# Patient Record
Sex: Female | Born: 1995
Health system: Southern US, Community
[De-identification: ages and names within clinical notes are randomized; demographics above are authoritative.]

## PROBLEM LIST (undated history)

## (undated) DIAGNOSIS — J45909 Unspecified asthma, uncomplicated: Secondary | ICD-10-CM

## (undated) DIAGNOSIS — K5900 Constipation, unspecified: Secondary | ICD-10-CM

## (undated) DIAGNOSIS — F988 Other specified behavioral and emotional disorders with onset usually occurring in childhood and adolescence: Secondary | ICD-10-CM

## (undated) DIAGNOSIS — F419 Anxiety disorder, unspecified: Secondary | ICD-10-CM

## (undated) DIAGNOSIS — K219 Gastro-esophageal reflux disease without esophagitis: Secondary | ICD-10-CM

## (undated) DIAGNOSIS — F32A Depression, unspecified: Secondary | ICD-10-CM

## (undated) DIAGNOSIS — F329 Major depressive disorder, single episode, unspecified: Secondary | ICD-10-CM

## (undated) HISTORY — DX: Anxiety disorder, unspecified: F41.9

## (undated) HISTORY — DX: Unspecified asthma, uncomplicated: J45.909

## (undated) HISTORY — DX: Other specified behavioral and emotional disorders with onset usually occurring in childhood and adolescence: F98.8

## (undated) HISTORY — DX: Gastro-esophageal reflux disease without esophagitis: K21.9

## (undated) HISTORY — DX: Constipation, unspecified: K59.00

---
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
## 2016-01-15 ENCOUNTER — Other Ambulatory Visit: Payer: Self-pay | Admitting: Family Medicine

## 2016-05-21 ENCOUNTER — Encounter: Payer: Self-pay | Admitting: Nurse Practitioner

## 2016-05-21 ENCOUNTER — Ambulatory Visit (INDEPENDENT_AMBULATORY_CARE_PROVIDER_SITE_OTHER): Payer: 59 | Admitting: Nurse Practitioner

## 2016-05-21 VITALS — BP 120/78 | Ht 65.0 in | Wt 152.0 lb

## 2016-05-21 DIAGNOSIS — N939 Abnormal uterine and vaginal bleeding, unspecified: Secondary | ICD-10-CM | POA: Diagnosis not present

## 2016-05-21 DIAGNOSIS — K580 Irritable bowel syndrome with diarrhea: Secondary | ICD-10-CM

## 2016-05-21 MED ORDER — HYOSCYAMINE SULFATE 0.125 MG SL SUBL: 0.1250 mg | SUBLINGUAL_TABLET | SUBLINGUAL | Status: DC | PRN

## 2016-05-21 MED ORDER — NORETHINDRONE ACET-ETHINYL EST 1-20 MG-MCG PO TABS: 1.0000 | ORAL_TABLET | Freq: Every day | ORAL | Status: DC

## 2016-05-21 MED FILL — LARIN 21 1-20 TABLET: 1-20 | 84 days supply | Qty: 63 | Fill #0

## 2016-05-21 NOTE — Progress Notes (Signed)
Subjective:  Presents for complaints of vaginal bleeding for the past 3 weeks. Usually has a cycle once a month lasting 5-6 days. Stopped her birth control pills about a month ago. Last intercourse was about 2 months ago with her fianc which was a long-term partner. No new sexual partners since that time. Has been with that sexual partner long-term. Also around the same time began having frequent small diarrhea bowel movements with occasional urgency and no results. No blood in her stool. No nausea vomiting. Intense lower abdominal cramps at times. Has had similar problems in the past with both her cycle in her bowels when she was under stress.  Working and finishing her GED. States she's been under more stress lately. Some difficulty getting a clear cohesive history from patient.  Objective:   BP 120/78 mmHg  Ht 5\' 5"  (1.651 m)  Wt 152 lb (68.947 kg)  BMI 25.29 kg/m2 NAD. Alert, oriented. Lungs clear. Heart regular rate rhythm. Abdomen soft nondistended with active bowel sounds 4; minimal lower abdominal tenderness more towards the left side, no rebound guarding. No specific pelvic pain noted. No obvious masses. Urine hCG negative.    Assessment:  Problem List Items Addressed This Visit      Digestive   Irritable bowel syndrome with diarrhea   Relevant Medications   hyoscyamine (LEVSIN/SL) 0.125 MG SL tablet    Other Visit Diagnoses    Abnormal uterine bleeding    -  Primary       Plan:Defers STD testing. Given written and verbal information on IBS with dietary measures. Warning signs reviewed. Restart birth control pills, take 2 today then 1 each day starting tomorrow. Call back by the end of the week if no improvement in her bowel symptoms or if bleeding persists. Discussed safe sex issues. Use backup method first pack of birth control pills. Meds ordered this encounter  Medications  . hyoscyamine (LEVSIN/SL) 0.125 MG SL tablet    Sig: Place 1 tablet (0.125 mg total) under the tongue  every 4 (four) hours as needed.    Dispense:  30 tablet    Refill:  0    Order Specific Question:  Supervising Provider    Answer:  Merlyn AlbertLUKING, WILLIAM S [[id number]]  . DISCONTD: norethindrone-ethinyl estradiol (MICROGESTIN) 1-20 MG-MCG tablet    Sig: Take 1 tablet by mouth daily.    Dispense:  21 tablet    Refill:  11    Order Specific Question:  Supervising Provider    Answer:  Merlyn AlbertLUKING, WILLIAM S [[id number]]  . norethindrone-ethinyl estradiol (MICROGESTIN) 1-20 MG-MCG tablet    Sig: Take 1 tablet by mouth daily.    Dispense:  3 Package    Refill:  3    Order Specific Question:  Supervising Provider    Answer:  Riccardo DubinLUKING, WILLIAM S [[id number]]

## 2016-05-21 NOTE — Addendum Note (Signed)
Addended by: Margaretha SheffieldBROWN, Onell Mcmath S on: 05/21/2016 03:32 PM   Modules accepted: Orders

## 2016-05-21 NOTE — Patient Instructions (Addendum)
Take 2 birth control pills today then starting tomorrow take one each day.   Irritable Bowel Syndrome, Adult Irritable bowel syndrome (IBS) is not one specific disease. It is a group of symptoms that affects the organs responsible for digestion (gastrointestinal or GI tract).  To regulate how your GI tract works, your body sends signals back and forth between your intestines and your brain. If you have IBS, there may be a problem with these signals. As a result, your GI tract does not function normally. Your intestines may become more sensitive and overreact to certain things. This is especially true when you eat certain foods or when you are under stress.  There are four types of IBS. These may be determined based on the consistency of your stool:   IBS with diarrhea.   IBS with constipation.   Mixed IBS.   Unsubtyped IBS.  It is important to know which type of IBS you have. Some treatments are more likely to be helpful for certain types of IBS.  CAUSES  The exact cause of IBS is not known. RISK FACTORS You may have a higher risk of IBS if:  You are a woman.  You are younger than 19 years old.  You have a family history of IBS.  You have mental health problems.  You have had bacterial infection of your GI tract. SIGNS AND SYMPTOMS  Symptoms of IBS vary from person to person. The main symptom is abdominal pain or discomfort. Additional symptoms usually include one or more of the following:   Diarrhea, constipation, or both.   Abdominal swelling or bloating.   Feeling full or sick after eating a small or regular-size meal.   Frequent gas.   Mucus in the stool.   A feeling of having more stool left after a bowel movement.  Symptoms tend to come and go. They may be associated with stress, psychiatric conditions, or nothing at all.  DIAGNOSIS  There is no specific test to diagnose IBS. Your health care provider will make a diagnosis based on a physical exam,  medical history, and your symptoms. You may have other tests to rule out other conditions that may be causing your symptoms. These may include:   Blood tests.   X-rays.   CT scan.  Endoscopy and colonoscopy. This is a test in which your GI tract is viewed with a long, thin, flexible tube. TREATMENT There is no cure for IBS, but treatment can help relieve symptoms. IBS treatment often includes:   Changes to your diet, such as:  Eating more fiber.  Avoiding foods that cause symptoms.  Drinking more water.  Eating regular, medium-sized portioned meals.  Medicines. These may include:  Fiber supplements if you have constipation.  Medicine to control diarrhea (antidiarrheal medicines).  Medicine to help control muscle spasms in your GI tract (antispasmodic medicines).  Medicines to help with any mental health issues, such as antidepressants or tranquilizers.  Therapy.  Talk therapy may help with anxiety, depression, or other mental health issues that can make IBS symptoms worse.  Stress reduction.  Managing your stress can help keep symptoms under control. HOME CARE INSTRUCTIONS   Take medicines only as directed by your health care provider.  Eat a healthy diet.  Avoid foods and drinks with added sugar.  Include more whole grains, fruits, and vegetables gradually into your diet. This may be especially helpful if you have IBS with constipation.  Avoid any foods and drinks that make your symptoms worse. These  may include dairy products and caffeinated or carbonated drinks.  Do not eat large meals.  Drink enough fluid to keep your urine clear or pale yellow.  Exercise regularly. Ask your health care provider for recommendations of good activities for you.  Keep all follow-up visits as directed by your health care provider. This is important. SEEK MEDICAL CARE IF:   You have constant pain.  You have trouble or pain with swallowing.  You have worsening  diarrhea. SEEK IMMEDIATE MEDICAL CARE IF:   You have severe and worsening abdominal pain.   You have diarrhea and:   You have a rash, stiff neck, or severe headache.   You are irritable, sleepy, or difficult to awaken.   You are weak, dizzy, or extremely thirsty.   You have bright red blood in your stool or you have black tarry stools.   You have unusual abdominal swelling that is painful.   You vomit continuously.   You vomit blood (hematemesis).   You have both abdominal pain and a fever.    This information is not intended to replace advice given to you by your health care provider. Make sure you discuss any questions you have with your health care provider.   Document Released: 12/17/2005 Document Revised: 01/07/2015 Document Reviewed: 09/03/2014 Elsevier Interactive Patient Education 2016 Elsevier Inc.   Diet for Irritable Bowel Syndrome When you have irritable bowel syndrome (IBS), the foods you eat and your eating habits are very important. IBS may cause various symptoms, such as abdominal pain, constipation, or diarrhea. Choosing the right foods can help ease discomfort caused by these symptoms. Work with your health care provider and dietitian to find the best eating plan to help control your symptoms. WHAT GENERAL GUIDELINES DO I NEED TO FOLLOW?  Keep a food diary. This will help you identify foods that cause symptoms. Write down:  What you eat and when.  What symptoms you have.  When symptoms occur in relation to your meals.  Avoid foods that cause symptoms. Talk with your dietitian about other ways to get the same nutrients that are in these foods.  Eat more foods that contain fiber. Take a fiber supplement if directed by your dietitian.  Eat your meals slowly, in a relaxed setting.  Aim to eat 5-6 small meals per day. Do not skip meals.  Drink enough fluids to keep your urine clear or pale yellow.  Ask your health care provider if you  should take an over-the-counter probiotic during flare-ups to help restore healthy gut bacteria.  If you have cramping or diarrhea, try making your meals low in fat and high in carbohydrates. Examples of carbohydrates are pasta, rice, whole grain breads and cereals, fruits, and vegetables.  If dairy products cause your symptoms to flare up, try eating less of them. You might be able to handle yogurt better than other dairy products because it contains bacteria that help with digestion. WHAT FOODS ARE NOT RECOMMENDED? The following are some foods and drinks that may worsen your symptoms:  Fatty foods, such as JamaicaFrench fries.  Milk products, such as cheese or ice cream.  Chocolate.  Alcohol.  Products with caffeine, such as coffee.  Carbonated drinks, such as soda. The items listed above may not be a complete list of foods and beverages to avoid. Contact your dietitian for more information. WHAT FOODS ARE GOOD SOURCES OF FIBER? Your health care provider or dietitian may recommend that you eat more foods that contain fiber. Fiber can help  reduce constipation and other IBS symptoms. Add foods with fiber to your diet a little at a time so that your body can get used to them. Too much fiber at once might cause gas and swelling of your abdomen. The following are some foods that are good sources of fiber:  Apples.  Peaches.  Pears.  Berries.  Figs.  Broccoli (raw).  Cabbage.  Carrots.  Raw peas.  Kidney beans.  Lima beans.  Whole grain bread.  Whole grain cereal. FOR MORE INFORMATION  International Foundation for Functional Gastrointestinal Disorders: www.iffgd.[employer] of Diabetes and Digestive and Kidney Diseases: http://norris-lawson.com/.aspx   This information is not intended to replace advice given to you by your health care provider. Make sure you discuss any questions you have with your health  care provider.   Document Released: 03/08/2004 Document Revised: 01/07/2015 Document Reviewed: 03/19/2014 Elsevier Interactive Patient Education [employer]

## 2016-05-22 LAB — GC/CHLAMYDIA PROBE AMP
Chlamydia trachomatis, NAA: NEGATIVE
Neisseria gonorrhoeae by PCR: NEGATIVE

## 2016-05-31 ENCOUNTER — Encounter: Payer: Self-pay | Admitting: Nurse Practitioner

## 2016-05-31 ENCOUNTER — Ambulatory Visit (INDEPENDENT_AMBULATORY_CARE_PROVIDER_SITE_OTHER): Payer: 59 | Admitting: Nurse Practitioner

## 2016-05-31 VITALS — BP 118/76 | Temp 98.6°F | Ht 65.0 in | Wt 144.0 lb

## 2016-05-31 DIAGNOSIS — N939 Abnormal uterine and vaginal bleeding, unspecified: Secondary | ICD-10-CM | POA: Diagnosis not present

## 2016-05-31 DIAGNOSIS — J0120 Acute ethmoidal sinusitis, unspecified: Secondary | ICD-10-CM | POA: Diagnosis not present

## 2016-05-31 DIAGNOSIS — J209 Acute bronchitis, unspecified: Secondary | ICD-10-CM | POA: Diagnosis not present

## 2016-05-31 DIAGNOSIS — K219 Gastro-esophageal reflux disease without esophagitis: Secondary | ICD-10-CM

## 2016-05-31 MED ORDER — NORGESTIMATE-ETH ESTRADIOL 0.25-35 MG-MCG PO TABS: 1.0000 | ORAL_TABLET | Freq: Every day | ORAL | Status: DC

## 2016-05-31 MED ORDER — AZITHROMYCIN 250 MG PO TABS: ORAL_TABLET | ORAL | Status: DC

## 2016-05-31 MED ORDER — PANTOPRAZOLE SODIUM 40 MG PO TBEC: 40.0000 mg | DELAYED_RELEASE_TABLET | Freq: Every day | ORAL | Status: DC

## 2016-05-31 MED ORDER — HYDROCODONE-HOMATROPINE 5-1.5 MG/5ML PO SYRP: 5.0000 mL | ORAL_SOLUTION | ORAL | Status: DC | PRN

## 2016-05-31 MED ORDER — ONDANSETRON 8 MG PO TBDP: 8.0000 mg | ORAL_TABLET | Freq: Three times a day (TID) | ORAL | Status: DC | PRN

## 2016-05-31 MED ORDER — ALBUTEROL SULFATE HFA 108 (90 BASE) MCG/ACT IN AERS: 2.0000 | INHALATION_SPRAY | RESPIRATORY_TRACT | Status: DC | PRN

## 2016-05-31 MED ORDER — SUCRALFATE 1 GM/10ML PO SUSP: 1.0000 g | Freq: Three times a day (TID) | ORAL | Status: DC

## 2016-05-31 MED FILL — PANTOPRAZOLE SOD DR 40 MG T: 40 | 90 days supply | Qty: 90 | Fill #0

## 2016-05-31 MED FILL — MONO-LINYAH 28 TABLET: 0.25-35 | 84 days supply | Qty: 84 | Fill #0

## 2016-05-31 NOTE — Patient Instructions (Signed)
Food Choices for Gastroesophageal Reflux Disease, Adult When you have gastroesophageal reflux disease (GERD), the foods you eat and your eating habits are very important. Choosing the right foods can help ease the discomfort of GERD. WHAT GENERAL GUIDELINES DO I NEED TO FOLLOW?  Choose fruits, vegetables, whole grains, low-fat dairy products, and low-fat meat, fish, and poultry.  Limit fats such as oils, salad dressings, butter, nuts, and avocado.  Keep a food diary to identify foods that cause symptoms.  Avoid foods that cause reflux. These may be different for different people.  Eat frequent small meals instead of three large meals each day.  Eat your meals slowly, in a relaxed setting.  Limit fried foods.  Cook foods using methods other than frying.  Avoid drinking alcohol.  Avoid drinking large amounts of liquids with your meals.  Avoid bending over or lying down until 2-3 hours after eating. WHAT FOODS ARE NOT RECOMMENDED? The following are some foods and drinks that may worsen your symptoms: Vegetables Tomatoes. Tomato juice. Tomato and spaghetti sauce. Chili peppers. Onion and garlic. Horseradish. Fruits Oranges, grapefruit, and lemon (fruit and juice). Meats High-fat meats, fish, and poultry. This includes hot dogs, ribs, ham, sausage, salami, and bacon. Dairy Whole milk and chocolate milk. Sour cream. Cream. Butter. Ice cream. Cream cheese.  Beverages Coffee and tea, with or without caffeine. Carbonated beverages or energy drinks. Condiments Hot sauce. Barbecue sauce.  Sweets/Desserts Chocolate and cocoa. Donuts. Peppermint and spearmint. Fats and Oils High-fat foods, including French fries and potato chips. Other Vinegar. Strong spices, such as black pepper, white pepper, red pepper, cayenne, curry powder, cloves, ginger, and chili powder. The items listed above may not be a complete list of foods and beverages to avoid. Contact your dietitian for more  information.   This information is not intended to replace advice given to you by your health care provider. Make sure you discuss any questions you have with your health care provider.   Document Released: 12/17/2005 Document Revised: 01/07/2015 Document Reviewed: 10/21/2013 Elsevier Interactive Patient Education 2016 Elsevier Inc.  

## 2016-06-01 ENCOUNTER — Encounter: Payer: Self-pay | Admitting: Nurse Practitioner

## 2016-06-01 DIAGNOSIS — N939 Abnormal uterine and vaginal bleeding, unspecified: Secondary | ICD-10-CM | POA: Insufficient documentation

## 2016-06-01 HISTORY — DX: Abnormal uterine and vaginal bleeding, unspecified: N93.9

## 2016-06-01 NOTE — Progress Notes (Signed)
Subjective:  Presents with her mother for c/o cough and congestion x 7-8 days. Has felt hot at times. Sore throat. Ethmoid area headache and pressure. Head congestion. Producing yellow mucus with streaks of blood at times. Frequent cough. Mild wheezing at night. No longer has an albuterol inhaler. Bilateral ear pain. Has also been having a flareup of her reflux, uncontrolled on omeprazole 40 mg daily. Having nausea and vomiting after eating and drinking, unassociated with any particular foods. Worse at nighttime. Slight diarrhea. Has a history of clearing her throat which is much worse between drainage and reflux. No difficulty swallowing. Minimal caffeine intake. Nonsmoker. Also patient has restarted her Microgestin, bleeding stopped for about 2 days but has persisted. Having severe cramping at times. Has taken this pill without difficulty in the past.  Objective:   BP 118/76 mmHg  Temp(Src) 98.6 F (37 C) (Oral)  Ht 5\' 5"  (1.651 m)  Wt 144 lb (65.318 kg)  BMI 23.96 kg/m2 NAD. Alert, oriented. TMs clear effusion, no erythema. Pharynx mild erythema with green PND noted. Neck supple with mild soft slightly tender anterior adenopathy. Lungs scattered coarse expiratory crackles, no wheezing or tachypnea. Heart regular rate rhythm. Abdomen soft nondistended with distinct epigastric area tenderness, otherwise benign. No obvious masses. Patient frequently clearing her throat.  Assessment:  Problem List Items Addressed This Visit      Digestive   Gastroesophageal reflux   Relevant Medications   ondansetron (ZOFRAN-ODT) 8 MG disintegrating tablet   sucralfate (CARAFATE) 1 GM/10ML suspension   pantoprazole (PROTONIX) 40 MG tablet     Genitourinary   Abnormal uterine bleeding    Other Visit Diagnoses    Acute ethmoidal sinusitis, recurrence not specified    -  Primary    Relevant Medications    azithromycin (ZITHROMAX Z-PAK) 250 MG tablet    HYDROcodone-homatropine (HYCODAN) 5-1.5 MG/5ML syrup    Acute bronchitis, unspecified organism          Plan:  Meds ordered this encounter  Medications  . azithromycin (ZITHROMAX Z-PAK) 250 MG tablet    Sig: Take 2 tablets (500 mg) on  Day 1,  followed by 1 tablet (250 mg) once daily on Days 2 through 5.    Dispense:  6 each    Refill:  0    Order Specific Question:  Supervising Provider    Answer:  Merlyn AlbertLUKING, WILLIAM S [[id number]]  . albuterol (PROVENTIL HFA;VENTOLIN HFA) 108 (90 Base) MCG/ACT inhaler    Sig: Inhale 2 puffs into the lungs every 4 (four) hours as needed.    Dispense:  1 Inhaler    Refill:  0    Order Specific Question:  Supervising Provider    Answer:  Merlyn AlbertLUKING, WILLIAM S [[id number]]  . HYDROcodone-homatropine (HYCODAN) 5-1.5 MG/5ML syrup    Sig: Take 5 mLs by mouth every 4 (four) hours as needed.    Dispense:  90 mL    Refill:  0    Order Specific Question:  Supervising Provider    Answer:  Merlyn AlbertLUKING, WILLIAM S [[id number]]  . ondansetron (ZOFRAN-ODT) 8 MG disintegrating tablet    Sig: Take 1 tablet (8 mg total) by mouth every 8 (eight) hours as needed for nausea or vomiting.    Dispense:  30 tablet    Refill:  0    Order Specific Question:  Supervising Provider    Answer:  Merlyn AlbertLUKING, WILLIAM S [[id number]]  . sucralfate (CARAFATE) 1 GM/10ML suspension    Sig: Take 10 mLs (1 g total) by mouth  4 (four) times daily -  with meals and at bedtime.    Dispense:  420 mL    Refill:  2    Order Specific Question:  Supervising Provider    Answer:  Merlyn Albert [[id number]]  . norgestimate-ethinyl estradiol (ORTHO-CYCLEN,SPRINTEC,PREVIFEM) 0.25-35 MG-MCG tablet    Sig: Take 1 tablet by mouth daily.    Dispense:  3 Package    Refill:  3    Order Specific Question:  Supervising Provider    Answer:  Merlyn Albert [[id number]]  . pantoprazole (PROTONIX) 40 MG tablet    Sig: Take 1 tablet (40 mg total) by mouth daily.    Dispense:  90 tablet    Refill:  1    Order Specific Question:  Supervising Provider    Answer:  Merlyn Albert [[id number]]   Review last  off factors affecting her reflux. Patient or family to call back in 7-10 days if reflux and abdominal pain have not resolved. Consider referral to GI specialist at that time. Warning signs reviewed. Call back in 4-5 days if no improvement in cough congestion, sooner if worse. Also call back in 7 days if bleeding and cramping have not significantly improved. Take current pill through Saturday and start a new pill on Sunday.

## 2017-07-26 ENCOUNTER — Other Ambulatory Visit: Payer: Self-pay | Admitting: Nurse Practitioner

## 2017-08-06 ENCOUNTER — Other Ambulatory Visit: Payer: Self-pay | Admitting: Nurse Practitioner

## 2017-08-06 ENCOUNTER — Other Ambulatory Visit: Payer: Self-pay | Admitting: *Deleted

## 2017-08-06 ENCOUNTER — Telehealth: Payer: Self-pay | Admitting: Family Medicine

## 2017-08-06 MED ORDER — NORGESTIMATE-ETH ESTRADIOL 0.25-35 MG-MCG PO TABS: 1.0000 | ORAL_TABLET | Freq: Every day | ORAL | 0 refills | Status: DC

## 2017-08-06 NOTE — Telephone Encounter (Signed)
Discussed with pt. Pt states she will call back to schedule office visit because she is at work right now and she needs to discuss her schedule with her mother before making appt. Refill sent to pharm.

## 2017-08-06 NOTE — Telephone Encounter (Signed)
One mo only , plz sched either reg o v or preferably  wellness Alison Mcmillan

## 2017-08-06 NOTE — Telephone Encounter (Signed)
Pt is needing refills on norgestimate-ethinyl estradiol (ORTHO-CYCLEN,SPRINTEC,PREVIFEM) 0.25-35 MG-MCG tablet   [facility]

## 2017-08-06 NOTE — Telephone Encounter (Signed)
Last visit 05/31/16. No upcoming appts.

## 2017-09-04 ENCOUNTER — Ambulatory Visit: Payer: 59 | Admitting: Family Medicine

## 2017-09-06 ENCOUNTER — Ambulatory Visit (INDEPENDENT_AMBULATORY_CARE_PROVIDER_SITE_OTHER): Payer: 59 | Admitting: Family Medicine

## 2017-09-06 ENCOUNTER — Encounter: Payer: Self-pay | Admitting: Family Medicine

## 2017-09-06 VITALS — BP 110/70 | Ht 65.0 in | Wt 150.0 lb

## 2017-09-06 DIAGNOSIS — J029 Acute pharyngitis, unspecified: Secondary | ICD-10-CM | POA: Diagnosis not present

## 2017-09-06 DIAGNOSIS — B349 Viral infection, unspecified: Secondary | ICD-10-CM | POA: Diagnosis not present

## 2017-09-06 LAB — POCT RAPID STREP A (OFFICE): Rapid Strep A Screen: NEGATIVE

## 2017-09-06 NOTE — Progress Notes (Signed)
   Subjective:    Patient ID: Alison Mcmillan, female    DOB: 07/29/1996, 21 y.o.   MRN: [id number]  Sore Throat   This is a new problem. Associated symptoms include congestion and coughing. Pertinent negatives include no ear pain or shortness of breath.   Has been a week since symptoms started,but noticed white spots in throat yesterday.Has been using saltwater and cough drops. Significant sore throat some fatigue tiredness symptoms present over the past week worse over the past few days worried about the possibility of strep throat  Review of Systems  Constitutional: Negative for activity change and fever.  HENT: Positive for congestion, rhinorrhea and sore throat. Negative for ear pain.   Eyes: Negative for discharge.  Respiratory: Positive for cough. Negative for shortness of breath and wheezing.   Cardiovascular: Negative for chest pain.       Results for orders placed or performed in visit on 09/06/17  POCT rapid strep A  Result Value Ref Range   Rapid Strep A Screen Negative Negative    Objective:   Physical Exam  Constitutional: She appears well-developed.  HENT:  Head: Normocephalic.  Right Ear: External ear normal.  Left Ear: External ear normal.  Nose: Nose normal.  Mouth/Throat: Oropharyngeal exudate present.  Eyes: Right eye exhibits no discharge. Left eye exhibits no discharge.  Neck: Neck supple. No tracheal deviation present.  Cardiovascular: Normal rate and normal heart sounds.   No murmur heard. Pulmonary/Chest: Effort normal and breath sounds normal. She has no wheezes. She has no rales.  Lymphadenopathy:    She has cervical adenopathy.  Skin: Skin is warm and dry.  Nursing note and vitals reviewed.   Adenopathy is noted. Mainly in the neck, throat erythematous      Assessment & Plan:  Viral syndrome Possible mono Zithromax cover for bacterial component Follow-up if progressive troubles or worse Await lab work

## 2017-09-07 LAB — STREP A DNA PROBE: Strep Gp A Direct, DNA Probe: NEGATIVE

## 2017-09-10 LAB — CBC WITH DIFFERENTIAL/PLATELET
Basophils Absolute: 0 10*3/uL (ref 0.0–0.2)
Basos: 0 %
EOS (ABSOLUTE): 0 10*3/uL (ref 0.0–0.4)
Eos: 1 %
Hematocrit: 38.2 % (ref 34.0–46.6)
Hemoglobin: 12.6 g/dL (ref 11.1–15.9)
Immature Grans (Abs): 0 10*3/uL (ref 0.0–0.1)
Immature Granulocytes: 0 %
Lymphocytes Absolute: 2 10*3/uL (ref 0.7–3.1)
Lymphs: 35 %
MCH: 29.8 pg (ref 26.6–33.0)
MCHC: 33 g/dL (ref 31.5–35.7)
MCV: 90 fL (ref 79–97)
Monocytes Absolute: 0.4 10*3/uL (ref 0.1–0.9)
Monocytes: 7 %
Neutrophils Absolute: 3.2 10*3/uL (ref 1.4–7.0)
Neutrophils: 57 %
Platelets: 293 10*3/uL (ref 150–379)
RBC: 4.23 x10E6/uL (ref 3.77–5.28)
RDW: 12.7 % (ref 12.3–15.4)
WBC: 5.7 10*3/uL (ref 3.4–10.8)

## 2017-09-10 LAB — EPSTEIN-BARR VIRUS VCA ANTIBODY PANEL
EBV Early Antigen Ab, IgG: 39.7 U/mL — ABNORMAL HIGH (ref 0.0–8.9)
EBV NA IgG: 573 U/mL — ABNORMAL HIGH (ref 0.0–17.9)
EBV VCA IgG: >600 U/mL — ABNORMAL HIGH (ref 0.0–17.9)
EBV VCA IgM: <36 U/mL (ref 0.0–35.9)

## 2017-09-11 ENCOUNTER — Encounter: Payer: Self-pay | Admitting: Family Medicine

## 2017-10-14 ENCOUNTER — Encounter: Payer: Self-pay | Admitting: Nurse Practitioner

## 2017-10-14 ENCOUNTER — Ambulatory Visit (INDEPENDENT_AMBULATORY_CARE_PROVIDER_SITE_OTHER): Payer: 59 | Admitting: Nurse Practitioner

## 2017-10-14 VITALS — BP 110/76 | Temp 98.8°F | Ht 65.0 in | Wt 155.0 lb

## 2017-10-14 DIAGNOSIS — R300 Dysuria: Secondary | ICD-10-CM | POA: Diagnosis not present

## 2017-10-14 DIAGNOSIS — Z3041 Encounter for surveillance of contraceptive pills: Secondary | ICD-10-CM

## 2017-10-14 DIAGNOSIS — Z113 Encounter for screening for infections with a predominantly sexual mode of transmission: Secondary | ICD-10-CM

## 2017-10-14 MED ORDER — NITROFURANTOIN MONOHYD MACRO 100 MG PO CAPS: 100.0000 mg | ORAL_CAPSULE | Freq: Two times a day (BID) | ORAL | 0 refills | Status: DC

## 2017-10-14 MED ORDER — NORGESTIMATE-ETH ESTRADIOL 0.25-35 MG-MCG PO TABS: 1.0000 | ORAL_TABLET | Freq: Every day | ORAL | 3 refills | Status: DC

## 2017-10-14 MED FILL — NORG-ETHIN ESTRA 0.25-0.035: 0.25-35 | 84 days supply | Qty: 84 | Fill #0

## 2017-10-14 NOTE — Patient Instructions (Addendum)
May take AZO for 48 hours then stop Birth control sent to [facility] Antibiotics for UTI sent to [facility]

## 2017-10-16 ENCOUNTER — Encounter: Payer: Self-pay | Admitting: Nurse Practitioner

## 2017-10-16 LAB — URINE CULTURE

## 2017-10-16 LAB — CHLAMYDIA/GONOCOCCUS/TRICHOMONAS, NAA
Chlamydia by NAA: NEGATIVE
Gonococcus by NAA: NEGATIVE
Trich vag by NAA: NEGATIVE

## 2017-10-16 NOTE — Progress Notes (Signed)
Subjective:  Presents for recheck on her birth control pills. Regular cycles, normal flow. Rare missed oc. Same sexual partner x 3 years. Acne doing well. Also c/o dysuria off/on x 1 week. No fever. Pain just with urination. Frequency, voiding small amounts at times. Some relief with AZO and cranberry tablets. Has increased her fluid intake. No pelvic pain. No back pain. No vaginal discharge.  Objective:   BP 110/76   Temp 98.8 F (37.1 C) (Oral)   Ht 5\' 5"  (1.651 m)   Wt 155 lb (70.3 kg)   LMP 09/30/2017   BMI 25.79 kg/m  NAD. Alert, oriented. Lungs clear. No CVA tenderness. Heart regular rate rhythm. Abdomen soft nondistended with minimal suprapubic area tenderness. Unable to do UA due to use of AZO. Urine micro occasional epithelial cell, otherwise clear.  Assessment:  Encounter for surveillance of contraceptive pills  Dysuria - Plan: Chlamydia/Gonococcus/Trichomonas, NAA, Urine Culture, CANCELED: POCT urinalysis dipstick  Screen for STD (sexually transmitted disease) - Plan: Chlamydia/Gonococcus/Trichomonas, NAA, Urine Culture    Plan:   Meds ordered this encounter  Medications  . norgestimate-ethinyl estradiol (ORTHO-CYCLEN,SPRINTEC,PREVIFEM) 0.25-35 MG-MCG tablet    Sig: Take 1 tablet by mouth daily.    Dispense:  3 Package    Refill:  3    Order Specific Question:   Supervising Provider    Answer:   Merlyn AlbertLUKING, WILLIAM S [[id number]]  . nitrofurantoin, macrocrystal-monohydrate, (MACROBID) 100 MG capsule    Sig: Take 1 capsule (100 mg total) by mouth 2 (two) times daily.    Dispense:  14 capsule    Refill:  0    Order Specific Question:   Supervising Provider    Answer:   Merlyn AlbertLUKING, WILLIAM S [[id number]]   Urine culture pending. Take AZO for 48 hours then discontinue. Reviewed safe sex issues. STD screening pending. Warning signs reviewed. Further follow-up based on culture results, call back sooner if symptoms worsen. Reminded patient to avoid missing pills. Return if symptoms worsen or  fail to improve.

## 2017-11-27 ENCOUNTER — Encounter: Payer: Self-pay | Admitting: Nurse Practitioner

## 2017-11-27 ENCOUNTER — Ambulatory Visit (INDEPENDENT_AMBULATORY_CARE_PROVIDER_SITE_OTHER): Payer: 59 | Admitting: Nurse Practitioner

## 2017-11-27 VITALS — BP 108/70 | Ht 65.0 in | Wt 165.4 lb

## 2017-11-27 DIAGNOSIS — Z3041 Encounter for surveillance of contraceptive pills: Secondary | ICD-10-CM

## 2017-11-27 MED ORDER — NORETHINDRONE ACET-ETHINYL EST 1-20 MG-MCG PO TABS: 1.0000 | ORAL_TABLET | Freq: Every day | ORAL | 3 refills | Status: DC

## 2017-11-27 MED FILL — LARIN 21 1-20 TABLET: 1-20 | 84 days supply | Qty: 63 | Fill #0

## 2017-11-28 ENCOUNTER — Encounter: Payer: Self-pay | Admitting: Nurse Practitioner

## 2017-11-28 NOTE — Progress Notes (Signed)
Subjective: Presents for recheck on her birth control pills.  Stopped her Ortho-Cyclen after the last pack due to bloating and breakthrough bleeding.  Would like to switch back to Loestrin since she has taken that in the past without difficulty.  Has noticed weight gain over the past 3 months.  Admits to stress eating and increased sugar intake including sodas.  No change in activity.  Has been off her pills for about a week.  Denies any previously missed pills.  Her last menstrual cycle was 1-2 weeks ago.  Same sexual partner for the past 3 years.  Denies fever pelvic pain or discharge.  Objective:   BP 108/70   Ht 5\' 5"  (1.651 m)   Wt 165 lb 6 oz (75 kg)   BMI 27.52 kg/m  NAD.  Alert, oriented.  Lungs clear.  Heart regular rate and rhythm.  Has gained 21 pounds since her visit on 05/31/2016.  Assessment:  Encounter for surveillance of contraceptive pills   Plan:   Meds ordered this encounter  Medications  . norethindrone-ethinyl estradiol (MICROGESTIN,JUNEL,LOESTRIN) 1-20 MG-MCG tablet    Sig: Take 1 tablet by mouth daily.    Dispense:  3 Package    Refill:  3    Order Specific Question:   Supervising Provider    Answer:   Riccardo DubinLUKING, WILLIAM S [[id number]]   Lengthy discussion regarding factors affecting weight gain.  Encouraged regular activity.  Stress reduction.  Also go back to healthy eating habits including diet low in sugar and simple carbs.  Call back if any heavy or prolonged bleeding on her new pill.  Use backup method from now until the first pack is complete. Return in about 1 year (around 11/27/2018).

## 2018-02-12 ENCOUNTER — Encounter: Payer: Self-pay | Admitting: Family Medicine

## 2018-02-12 ENCOUNTER — Ambulatory Visit (INDEPENDENT_AMBULATORY_CARE_PROVIDER_SITE_OTHER): Payer: Self-pay | Admitting: Family Medicine

## 2018-02-12 VITALS — BP 110/68 | Temp 98.2°F | Ht 65.0 in | Wt 164.0 lb

## 2018-02-12 DIAGNOSIS — N3000 Acute cystitis without hematuria: Secondary | ICD-10-CM

## 2018-02-12 DIAGNOSIS — R109 Unspecified abdominal pain: Secondary | ICD-10-CM

## 2018-02-12 LAB — POCT URINALYSIS DIPSTICK
Blood, UA: 50
Spec Grav, UA: >=1.03 — AB (ref 1.010–1.025)
pH, UA: 5 (ref 5.0–8.0)

## 2018-02-12 MED ORDER — SULFAMETHOXAZOLE-TRIMETHOPRIM 800-160 MG PO TABS: ORAL_TABLET | ORAL | 0 refills | Status: DC

## 2018-02-12 MED ORDER — PROMETHAZINE HCL 25 MG PO TABS: 25.0000 mg | ORAL_TABLET | Freq: Three times a day (TID) | ORAL | 0 refills | Status: DC | PRN

## 2018-02-12 NOTE — Progress Notes (Signed)
   Subjective:    Patient ID: Alison SchatzAmelia R Southwood, female    DOB: 11/08/1996, 22 y.o.   MRN: [id number]  Emesis   This is a new problem. Episode onset: one week ago. Associated symptoms include abdominal pain, coughing and diarrhea. Associated symptoms comments: Headache, body aches. Treatments tried: ciprofloxacin, ondansetron.   Went to urgent care last Saturday. Diagnosis with UTI. Taking cipro.   Wake up in the morn feeling a bit nauseated or not, and then vomiting  Took preg test and ngeg, and monthly came along and then neg   Trouble vomiting   Head hurting   Cough off and on the past few days worse with   Results for orders placed or performed in visit on 02/12/18  POCT urinalysis dipstick  Result Value Ref Range   Color, UA     Clarity, UA     Glucose, UA     Bilirubin, UA     Ketones, UA +    Spec Grav, UA >=1.030 (A) 1.010 - 1.025   Blood, UA 50    pH, UA 5.0 5.0 - 8.0   Protein, UA     Urobilinogen, UA  0.2 or 1.0 E.U./dL   Nitrite, UA     Leukocytes, UA  Negative   Appearance     Odor       Review of Systems  Respiratory: Positive for cough.   Gastrointestinal: Positive for abdominal pain, diarrhea and vomiting.       Objective:   Physical Exam   alert active talkative no acute distress.  Lungs clear.  Heart HEENT mild nasal congestion.  Pharynx normal.  The rate and rhythm.  No obvious CVA tenderness positive suprapubic tenderness.  Bladder nontender diffusely right lower quadrant suprapubic and left lower quadrant.  Bowel sounds excellent.    Numerous white blood cells    Assessment & Plan:  Impression partly treated urinary tract with now superimposed respiratory illness.  Plan Bactrim DS twice daily for 10 days.  Warning signs discussed carefully.  If abdominal pain worsens will need for workup discussed.  Phenergan as needed for nausea

## 2018-02-23 ENCOUNTER — Encounter (HOSPITAL_COMMUNITY): Payer: Self-pay | Admitting: Emergency Medicine

## 2018-02-23 ENCOUNTER — Emergency Department (HOSPITAL_COMMUNITY)
Admission: EM | Admit: 2018-02-23 | Discharge: 2018-02-23 | Disposition: A | Discharge status: Home / Self Care | Payer: 59 | Attending: Emergency Medicine | Admitting: Emergency Medicine

## 2018-02-23 DIAGNOSIS — N939 Abnormal uterine and vaginal bleeding, unspecified: Secondary | ICD-10-CM

## 2018-02-23 DIAGNOSIS — S1093XA Contusion of unspecified part of neck, initial encounter: Secondary | ICD-10-CM | POA: Insufficient documentation

## 2018-02-23 DIAGNOSIS — Z79899 Other long term (current) drug therapy: Secondary | ICD-10-CM | POA: Insufficient documentation

## 2018-02-23 DIAGNOSIS — S3023XA Contusion of vagina and vulva, initial encounter: Secondary | ICD-10-CM | POA: Insufficient documentation

## 2018-02-23 DIAGNOSIS — Y998 Other external cause status: Secondary | ICD-10-CM | POA: Insufficient documentation

## 2018-02-23 DIAGNOSIS — Y33XXXA Other specified events, undetermined intent, initial encounter: Secondary | ICD-10-CM | POA: Insufficient documentation

## 2018-02-23 DIAGNOSIS — Y929 Unspecified place or not applicable: Secondary | ICD-10-CM | POA: Insufficient documentation

## 2018-02-23 DIAGNOSIS — J45909 Unspecified asthma, uncomplicated: Secondary | ICD-10-CM | POA: Insufficient documentation

## 2018-02-23 DIAGNOSIS — S3141XA Laceration without foreign body of vagina and vulva, initial encounter: Secondary | ICD-10-CM | POA: Insufficient documentation

## 2018-02-23 DIAGNOSIS — Y9389 Activity, other specified: Secondary | ICD-10-CM | POA: Insufficient documentation
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
## 2018-02-23 NOTE — Discharge Instructions (Signed)
Use ice for comfort.  You can take ibuprofen 600 mg plus Tylenol 650 mg every 6 hours for pain if needed.  [facility] call [facility] to get a follow-up.  Dr. Emelda FearFerguson wants you to avoid sexual activity for at least 2 weeks.  Please call his office if you have any change in condition such as being unable to urinate, worsening pain, heavy bleeding.  You may notice some burning when you urinate due to the abrasions in your groin.  You can rub the painful areas with Vaseline before urinating to see if that helps with the discomfort.

## 2018-02-24 LAB — GC/CHLAMYDIA PROBE AMP (~~LOC~~) NOT AT ARMC
Chlamydia: NEGATIVE
Neisseria Gonorrhea: NEGATIVE

## 2018-04-07 ENCOUNTER — Other Ambulatory Visit: Payer: Self-pay

## 2018-04-07 ENCOUNTER — Telehealth: Payer: Self-pay | Admitting: Family Medicine

## 2018-04-07 MED ORDER — ALBUTEROL SULFATE HFA 108 (90 BASE) MCG/ACT IN AERS: 2.0000 | INHALATION_SPRAY | RESPIRATORY_TRACT | 0 refills | Status: DC | PRN

## 2018-04-07 NOTE — Telephone Encounter (Signed)
Pt is needing a refill on albuterol (PROVENTIL HFA;VENTOLIN HFA) 108 (90 Base) MCG/ACT inhaler   [facility] [place]  

## 2018-04-07 NOTE — Telephone Encounter (Signed)
Please advise 

## 2018-04-07 NOTE — Telephone Encounter (Signed)
Ok plus one ref 

## 2018-06-03 ENCOUNTER — Ambulatory Visit: Payer: Self-pay | Admitting: Family Medicine

## 2018-06-03 ENCOUNTER — Encounter: Payer: Self-pay | Admitting: Family Medicine

## 2018-06-03 VITALS — BP 104/80 | Temp 98.2°F | Ht 65.0 in | Wt 163.0 lb

## 2018-06-03 DIAGNOSIS — J4520 Mild intermittent asthma, uncomplicated: Secondary | ICD-10-CM

## 2018-06-03 DIAGNOSIS — J329 Chronic sinusitis, unspecified: Secondary | ICD-10-CM

## 2018-06-03 DIAGNOSIS — J310 Chronic rhinitis: Secondary | ICD-10-CM

## 2018-06-03 MED ORDER — AMOXICILLIN 500 MG PO CAPS: 500.0000 mg | ORAL_CAPSULE | Freq: Three times a day (TID) | ORAL | 0 refills | Status: DC

## 2018-06-03 NOTE — Progress Notes (Signed)
   Subjective:    Patient ID: Alison Mcmillan, female    DOB: 10/21/1996, 21 y.o.   MRN: [id number]  HPI  Patient is here today due to a cough wheezing and congestion, vomiting, bilateral ear pain but right ear worse,sore throat, headache,sore throat.  Had an inhaler around but has nt picked up yet  Not on any med insurance   Cough feels tight often not an y productoive  yelllow disch arge and cough clear at ties too   Review of Systems No headache, no major weight loss or weight gain, no chest pain no back pain abdominal pain no change in bowel habits complete ROS otherwise negative     Objective:   Physical Exam  Alert, mild malaise. Hydration good Vitals stable. frontal/ maxillary tenderness evident positive nasal congestion. pharynx normal neck supple  lungs clear/no crackles or wheezes. heart regular in rhythm       Assessment & Plan:  Impression rhinosinusitis//bronchitis with element of reactive airways.  Metered-dose inhaler prescribed.  Likely post viral, discussed with patient. plan antibiotics prescribed. Questions answered. Symptomatic care discussed. warning signs discussed. WSL

## 2018-06-13 ENCOUNTER — Other Ambulatory Visit: Payer: Self-pay

## 2018-06-13 ENCOUNTER — Emergency Department (HOSPITAL_COMMUNITY)
Admission: EM | Admit: 2018-06-13 | Discharge: 2018-06-13 | Disposition: A | Discharge status: Home / Self Care | Payer: Self-pay | Attending: Emergency Medicine | Admitting: Emergency Medicine

## 2018-06-13 ENCOUNTER — Encounter (HOSPITAL_COMMUNITY): Payer: Self-pay | Admitting: Emergency Medicine

## 2018-06-13 DIAGNOSIS — F17210 Nicotine dependence, cigarettes, uncomplicated: Secondary | ICD-10-CM | POA: Insufficient documentation

## 2018-06-13 DIAGNOSIS — Z79899 Other long term (current) drug therapy: Secondary | ICD-10-CM | POA: Insufficient documentation

## 2018-06-13 DIAGNOSIS — W57XXXA Bitten or stung by nonvenomous insect and other nonvenomous arthropods, initial encounter: Secondary | ICD-10-CM | POA: Insufficient documentation

## 2018-06-13 DIAGNOSIS — J45909 Unspecified asthma, uncomplicated: Secondary | ICD-10-CM | POA: Insufficient documentation

## 2018-06-13 DIAGNOSIS — Y939 Activity, unspecified: Secondary | ICD-10-CM | POA: Insufficient documentation

## 2018-06-13 DIAGNOSIS — Y929 Unspecified place or not applicable: Secondary | ICD-10-CM | POA: Insufficient documentation

## 2018-06-13 DIAGNOSIS — T63441A Toxic effect of venom of bees, accidental (unintentional), initial encounter: Secondary | ICD-10-CM

## 2018-06-13 DIAGNOSIS — Y999 Unspecified external cause status: Secondary | ICD-10-CM | POA: Insufficient documentation

## 2018-06-13 DIAGNOSIS — S60561A Insect bite (nonvenomous) of right hand, initial encounter: Secondary | ICD-10-CM | POA: Insufficient documentation

## 2018-06-13 MED ORDER — PREDNISONE 10 MG PO TABS: 20.0000 mg | ORAL_TABLET | Freq: Every day | ORAL | 0 refills | Status: DC

## 2018-06-13 MED ORDER — PREDNISONE 20 MG PO TABS
20.0000 mg | ORAL_TABLET | Freq: Once | ORAL | Status: AC
  Administered 2018-06-13: 20 mg via ORAL
  Filled 2018-06-13: qty 1

## 2018-06-13 NOTE — Discharge Instructions (Addendum)
You were seen in the emergency department today following a hornet sting.  We suspect the swelling, itching, pain to be related to this.  Please continue to take Benadryl per over-the-counter dosing instructions as well as apply ice to this area wrapped in a towel ( 20 minutes on 40 minutes off).  We are also sending you home with 2 days worth of steroids.  We gave you a dose in the emergency department.  Please take 20 mg (2 tablets) of steroids tomorrow and the subsequent day.  This should help with the swelling.  Follow-up with your primary care provider within 1 week.  Return to the ER immediately for new or worsening symptoms including but not limited to trouble breathing, facial swelling, feeling like your throat is closing, or any other concerns.

## 2018-06-13 NOTE — ED Triage Notes (Signed)
Patient states she was stung on her right hand by a hornet yesterday. States when she was small she was allergic to bees. Swelling noted to right hand at triage. States she last took benadryl last night.

## 2018-06-13 NOTE — ED Provider Notes (Signed)
[facility] EMERGENCY DEPARTMENT Provider Note   CSN: [id number] Arrival date & time: 06/13/18  1023     History   Chief Complaint Chief Complaint  Patient presents with  . Insect Bite    HPI Alison Mcmillan is a 22 y.o. female with a hx of asthma, depression, IBS, and GERD who presents to the ED with with complaints of right hand swelling itching status post hornet sting last night at 1800. Patient states she was stung on her R 2nd digit- palmar aspect. States that she developed pain and swelling to this area which spread to the hand. She states the area is itchy as well. Woke up this AM and sxs seemed to be a bit worse. She states she took benadryl last night and this AM with some improvement. Also applied ice last night. Rates pain an 8/10 in severity. No specific alleviating/aggravating factors other than what is mentioned above. Denies dyspnea, sensation of throat closing, difficulty swallowing, or stridor at any time since sting. States she was allergic to bees as a child- states would get swelling, never had hx of trouble breathing that she knows of.   HPI  Past Medical History:  Diagnosis Date  . ADD (attention deficit disorder)   . Asthma   . Constipation   . Depression   . Gastroesophageal reflux     Patient Active Problem List   Diagnosis Date Noted  . Abnormal uterine bleeding 06/01/2016  . Irritable bowel syndrome with diarrhea 05/21/2016  . ADHD (attention deficit hyperactivity disorder) 01/20/2014  . ADD (attention deficit disorder) 11/24/2013  . Depression 11/24/2013  . Allergic rhinitis 11/18/2013  . Asthma, chronic 11/04/2013  . Family history of diabetes mellitus 01/20/2013  . Epigastric abdominal pain 01/07/2013  . Gastroesophageal reflux   . Simple constipation     History reviewed. No pertinent surgical history.   OB History   None      Home Medications    Prior to Admission medications   Medication Sig Start Date End Date Taking?  Authorizing Provider  albuterol (PROVENTIL HFA;VENTOLIN HFA) 108 (90 Base) MCG/ACT inhaler Inhale 2 puffs into the lungs every 4 (four) hours as needed. 04/07/18   Babs SciaraLuking, Scott A, MD  amoxicillin (AMOXIL) 500 MG capsule Take 1 capsule (500 mg total) by mouth 3 (three) times daily. 06/03/18   Merlyn AlbertLuking, William S, MD  hyoscyamine (LEVSIN/SL) 0.125 MG SL tablet Place 1 tablet (0.125 mg total) under the tongue every 4 (four) hours as needed. 05/21/16   Campbell RichesHoskins, Carolyn C, NP  norethindrone-ethinyl estradiol (MICROGESTIN,JUNEL,LOESTRIN) 1-20 MG-MCG tablet Take 1 tablet by mouth daily. 11/27/17   Campbell RichesHoskins, Carolyn C, NP  ondansetron (ZOFRAN-ODT) 8 MG disintegrating tablet Take 1 tablet (8 mg total) by mouth every 8 (eight) hours as needed for nausea or vomiting. 05/31/16   Campbell RichesHoskins, Carolyn C, NP  pantoprazole (PROTONIX) 40 MG tablet Take 1 tablet (40 mg total) by mouth daily. 05/31/16   Campbell RichesHoskins, Carolyn C, NP  promethazine (PHENERGAN) 25 MG tablet Take 1 tablet (25 mg total) by mouth every 8 (eight) hours as needed for nausea or vomiting. 02/12/18   Merlyn AlbertLuking, William S, MD  sucralfate (CARAFATE) 1 GM/10ML suspension Take 10 mLs (1 g total) by mouth 4 (four) times daily -  with meals and at bedtime. 05/31/16   Campbell RichesHoskins, Carolyn C, NP  sulfamethoxazole-trimethoprim (BACTRIM DS,SEPTRA DS) 800-160 MG tablet One p o bid for ten d Patient not taking: Reported on 06/03/2018 02/12/18   Merlyn AlbertLuking, William S, MD  Family History Family History  Problem Relation Age of Onset  . Arthritis Sister   . Depression Sister   . ADD / ADHD Brother   . Pancreatitis Mother   . Bipolar disorder Mother   . Drug abuse Mother   . ADD / ADHD Mother   . Pancreatitis Maternal Uncle   . Cholelithiasis Maternal Grandmother   . Bipolar disorder Maternal Grandmother   . Depression Maternal Grandmother   . Pancreatitis Maternal Uncle   . Alcohol abuse Father   . ADD / ADHD Father     Social History Social History   Tobacco Use  . Smoking  status: Current Some Day Smoker  . Smokeless tobacco: Never Used  Substance Use Topics  . Alcohol use: No  . Drug use: No     Allergies   Strattera [atomoxetine hcl]   Review of Systems Review of Systems  Constitutional: Negative for chills and fever.  HENT: Negative for drooling, facial swelling and trouble swallowing.   Respiratory: Negative for choking, shortness of breath, wheezing and stridor.   Musculoskeletal:       Positive for pain, swelling, and itching to the right hand.  Neurological: Negative for weakness and numbness.     Physical Exam Updated Vital Signs BP 128/90 (BP Location: Right Arm)   Pulse 72   Temp 98.8 F (37.1 C) (Oral)   Resp 16   Ht 5\' 5"  (1.651 m)   Wt 73.9 kg (163 lb)   LMP 06/05/2018   SpO2 96%   BMI 27.12 kg/m   Physical Exam  Constitutional: She appears well-developed and well-nourished.  Non-toxic appearance. No distress.  HENT:  Head: Normocephalic and atraumatic.  Right Ear: Tympanic membrane normal.  Left Ear: Tympanic membrane normal.  Nose: Nose normal.  Mouth/Throat: Uvula is midline and oropharynx is clear and moist. No oropharyngeal exudate, posterior oropharyngeal edema or posterior oropharyngeal erythema.  Patient is tolerating her own secretions without difficulty.  Airway is patent.  No trismus.  No drooling.  No hot potato voice.  Eyes: Conjunctivae are normal. Right eye exhibits no discharge. Left eye exhibits no discharge.  Cardiovascular: Normal rate and regular rhythm.  No murmur heard. Pulses:      Radial pulses are 2+ on the right side, and 2+ on the left side.  Pulmonary/Chest: Effort normal and breath sounds normal. No stridor. No respiratory distress. She has no wheezes. She has no rhonchi. She has no rales.  Musculoskeletal:  Upper extremities: Patient has a small puncture area to the skin on the palmar surface of the second right proximal phalanx consistent with sting-no significant surrounding erythema or  discharge from this area.  Patient's right hand is diffusely swollen extending to the distal aspect of the forearm.  She has normal range of motion.  Second digit is tender to palpation in area of bee sting.  Otherwise fairly nontender.  NVI distally.  Neurological: She is alert.  Clear speech.  Sensation grossly intact bilateral upper extremities.  Patient has 5 out of 5 symmetric grip strength.  Psychiatric: She has a normal mood and affect. Her behavior is normal. Thought content normal.  Nursing note and vitals reviewed.        ED Treatments / Results  Labs (all labs ordered are listed, but only abnormal results are displayed) Labs Reviewed - No data to display  EKG None  Radiology No results found.  Procedures Procedures (including critical care time)  Medications Ordered in ED Medications  predniSONE (DELTASONE)  tablet 20 mg (has no administration in time range)    Initial Impression / Assessment and Plan / ED Course  I have reviewed the triage vital signs and the nursing notes.  Pertinent labs & imaging results that were available during my care of the patient were reviewed by me and considered in my medical decision making (see chart for details).   Patient presents status post hornet sting with what appears to be a large localized reaction to R hand/distal forearm.  Patient nontoxic appearing, no apparent distress, vitals WNL.  No respiratory distress, no sensation of throat closing, patient is tolerating her own secretions without difficulty.  No stridor.  No appreciable facial swelling. No indication of anaphylaxis. Recommended continued use of Benadryl and application of ice packs.  Will give very short course of steroids given amount of swelling. I discussed treatment plan, need for PCP follow-up, and strict return precautions with the patient. Provided opportunity for questions, patient confirmed understanding and is in agreement with plan.    Final Clinical  Impressions(s) / ED Diagnoses   Final diagnoses:  Bee sting, accidental or unintentional, initial encounter    ED Discharge Orders        Ordered    predniSONE (DELTASONE) 10 MG tablet  Daily     06/13/18 1303       Cherly Anderson, PA-C 06/13/18 1648    Raeford Razor, MD 06/15/18 [phone]

## 2018-08-13 ENCOUNTER — Encounter: Payer: Self-pay | Admitting: Family Medicine

## 2018-08-13 ENCOUNTER — Ambulatory Visit (INDEPENDENT_AMBULATORY_CARE_PROVIDER_SITE_OTHER): Payer: Self-pay | Admitting: Family Medicine

## 2018-08-13 VITALS — Temp 98.1°F | Ht 65.0 in | Wt 162.4 lb

## 2018-08-13 DIAGNOSIS — R300 Dysuria: Secondary | ICD-10-CM

## 2018-08-13 DIAGNOSIS — N10 Acute tubulo-interstitial nephritis: Secondary | ICD-10-CM

## 2018-08-13 LAB — POCT URINALYSIS DIPSTICK
Spec Grav, UA: 1.02 (ref 1.010–1.025)
pH, UA: 6 (ref 5.0–8.0)

## 2018-08-13 MED ORDER — CIPROFLOXACIN HCL 500 MG PO TABS: 500.0000 mg | ORAL_TABLET | Freq: Two times a day (BID) | ORAL | 0 refills | Status: AC

## 2018-08-13 MED ORDER — ONDANSETRON 4 MG PO TBDP: 4.0000 mg | ORAL_TABLET | Freq: Three times a day (TID) | ORAL | 0 refills | Status: DC | PRN

## 2018-08-13 NOTE — Progress Notes (Signed)
   Subjective:    Patient ID: Alison Mcmillan, female    DOB: 10/15/1996, 22 y.o.   MRN: [id number]  HPI Patient arrives with dysuria and nausea for a week.  One week ago started with dysuria  Then incr freq  Then dim energy   Some nausea  Some vomiting  Headaches  Energy level    Appetite hungry but not as much as susau''   Review of Systems No headache, no major weight loss or weight gain, no chest pain no back pain abdominal pain no change in bowel habits complete ROS otherwise negative     Objective:   Physical Exam  Alert vitals stable, NAD. Blood pressure good on repeat. HEENT normal. Lungs clear. Heart regular rate and rhythm. Mild left CVA tenderness  Urinalysis numerous white blood cells per high-power field  Impression urinary tract infection question element of pyelonephritis discussed with patient Cipro at appropriate dose warning signs discussed Zofran as needed for nausea      Assessment & Plan:

## 2018-08-25 ENCOUNTER — Other Ambulatory Visit: Payer: Self-pay | Admitting: Family Medicine

## 2018-09-05 ENCOUNTER — Telehealth: Payer: Self-pay | Admitting: *Deleted

## 2018-09-05 MED ORDER — NORETHINDRONE ACET-ETHINYL EST 1-20 MG-MCG PO TABS: 1.0000 | ORAL_TABLET | Freq: Every day | ORAL | 1 refills | Status: DC

## 2018-09-05 NOTE — Telephone Encounter (Signed)
Fax from [facility]. Pt requesting a refill on noreth/ethin 1/20 #21 take one daily. Note on request that pt does not like sprintec. Can med be changed back. Last seen for birth control 11/27/17

## 2018-09-05 NOTE — Telephone Encounter (Signed)
Patient would like a refill of noreth/ethin 1/20 (loestrin) #21 take one daily. Patient was on this med before being switched to sprintec and does not like sprintec and wants to switch back

## 2018-09-05 NOTE — Addendum Note (Signed)
Addended by: Margaretha Sheffield on: 09/05/2018 12:55 PM   Modules accepted: Orders

## 2018-09-05 NOTE — Telephone Encounter (Signed)
Please try to help me out- what are they requesting to change back to?

## 2018-09-05 NOTE — Telephone Encounter (Signed)
Prescription sent electronically to pharmacy. 

## 2018-09-05 NOTE — Telephone Encounter (Signed)
It would be fine to give 6 refills of the medicine she is requesting thank you

## 2018-09-08 ENCOUNTER — Telehealth: Payer: Self-pay | Admitting: Family Medicine

## 2018-09-08 MED ORDER — CIPROFLOXACIN HCL 500 MG PO TABS: 500.0000 mg | ORAL_TABLET | Freq: Two times a day (BID) | ORAL | 0 refills | Status: AC

## 2018-09-08 NOTE — Telephone Encounter (Signed)
Patient here on 08/13/18 for UTI and was given Cipro.  Now symptoms are back and she still has some cipro left and wants to know if ok to take?

## 2018-09-08 NOTE — Telephone Encounter (Signed)
Tried calling no vm set up at this time. I have sent in the medication to [facility].

## 2018-09-08 NOTE — Telephone Encounter (Signed)
I would recommend Cipro 500 mg twice daily for 5 days

## 2018-09-08 NOTE — Telephone Encounter (Signed)
Patient seen 08/13/18 with UTI and given Cipro 500 #20 one twice a day for 10 days. Patient did not complete full 10 days of medication and the dysuria hs returned and she wonders if she should finish the Cipro she has left

## 2018-09-12 NOTE — Telephone Encounter (Signed)
I called left a message for the pt to r/c. I called [facility] to see if pt has picked up and per Brett CanalesSteve the medication was still there. He gave a number they had listed of [phone] and no answer at that number.

## 2018-09-18 NOTE — Telephone Encounter (Signed)
Patient notified and stated her symptoms seem to come and go so she is going to fill the prescription and take the full course  to knock it completely out.

## 2018-11-24 ENCOUNTER — Encounter: Payer: Self-pay | Admitting: Family Medicine

## 2018-11-24 ENCOUNTER — Other Ambulatory Visit: Payer: Self-pay | Admitting: Family Medicine

## 2018-11-24 ENCOUNTER — Ambulatory Visit (INDEPENDENT_AMBULATORY_CARE_PROVIDER_SITE_OTHER): Payer: Self-pay | Admitting: Family Medicine

## 2018-11-24 VITALS — BP 122/84 | Temp 98.6°F | Ht 65.0 in | Wt 168.0 lb

## 2018-11-24 DIAGNOSIS — J310 Chronic rhinitis: Secondary | ICD-10-CM

## 2018-11-24 DIAGNOSIS — J329 Chronic sinusitis, unspecified: Secondary | ICD-10-CM

## 2018-11-24 DIAGNOSIS — R300 Dysuria: Secondary | ICD-10-CM

## 2018-11-24 LAB — POCT URINALYSIS DIPSTICK
Spec Grav, UA: >=1.03 — AB (ref 1.010–1.025)
pH, UA: 5 (ref 5.0–8.0)

## 2018-11-24 MED ORDER — BENZONATATE 100 MG PO CAPS: 100.0000 mg | ORAL_CAPSULE | Freq: Three times a day (TID) | ORAL | 0 refills | Status: DC | PRN

## 2018-11-24 MED ORDER — AMOXICILLIN 500 MG PO CAPS: 500.0000 mg | ORAL_CAPSULE | Freq: Three times a day (TID) | ORAL | 0 refills | Status: DC

## 2018-11-24 NOTE — Progress Notes (Signed)
   Subjective:    Patient ID: Alison Mcmillan, female    DOB: 02/03/1996, 22 y.o.   MRN: [id number]  HPI Patient is here today with complaints of a reoccurring uti.She says she has been having some painful urination,sensitive to touch, urine is a dark color she noticed this two days ago. She has not taken any medications for this. She is also having diarrhea and some abd pain.  Last coupe days some burning with urination    Pos diarrhea  pso vomiting , possibly due to cough    Two days ago th  Cough kicked in farly hard,, got the a c turned on which seemed to flar e u things    Some h a some smuscle achres   enetgy level not good   Appetite ok not great     Review of Systems Results for orders placed or performed in visit on 11/24/18  POCT urinalysis dipstick  Result Value Ref Range   Color, UA     Clarity, UA     Glucose, UA     Bilirubin, UA     Ketones, UA     Spec Grav, UA >=1.030 (A) 1.010 - 1.025   Blood, UA 1+    pH, UA 5.0 5.0 - 8.0   Protein, UA     Urobilinogen, UA     Nitrite, UA     Leukocytes, UA Trace (A) Negative   Appearance     Odor         Objective:   Physical Exam  Alert active moderate malaise.  Mild/moderate nasal congestion pharynx normal lungs intermittent bronchial cough heart rate and rhythm  Urinalysis concentrated.  Rare white blood cell      Assessment & Plan:  Impression post viral rhinosinusitis.  May well have had flu.  Doubt UTI.  Plan antibiotics prescribed.  Tessalon prescribed.  Symptom care discussed.

## 2019-01-06 ENCOUNTER — Encounter: Payer: Self-pay | Admitting: Family Medicine

## 2019-01-06 ENCOUNTER — Ambulatory Visit: Payer: Self-pay | Admitting: Family Medicine

## 2019-01-06 ENCOUNTER — Ambulatory Visit (INDEPENDENT_AMBULATORY_CARE_PROVIDER_SITE_OTHER): Payer: Self-pay | Admitting: Family Medicine

## 2019-01-06 VITALS — Temp 98.1°F | Wt 177.2 lb

## 2019-01-06 DIAGNOSIS — J329 Chronic sinusitis, unspecified: Secondary | ICD-10-CM

## 2019-01-06 DIAGNOSIS — J310 Chronic rhinitis: Secondary | ICD-10-CM

## 2019-01-06 MED ORDER — SULFAMETHOXAZOLE-TRIMETHOPRIM 800-160 MG PO TABS: ORAL_TABLET | ORAL | 0 refills | Status: DC

## 2019-01-06 MED ORDER — PREDNISONE 20 MG PO TABS: ORAL_TABLET | ORAL | 0 refills | Status: DC

## 2019-01-06 MED ORDER — BENZONATATE 100 MG PO CAPS: ORAL_CAPSULE | ORAL | 0 refills | Status: DC

## 2019-01-06 NOTE — Progress Notes (Signed)
   Subjective:    Patient ID: Alison Mcmillan, female    DOB: 05-31-1996, 23 y.o.   MRN: [id number]  Cough  This is a recurrent problem. The current episode started more than 1 month ago. The cough is productive of sputum. Associated symptoms include rhinorrhea. Associated symptoms comments: Pt states sometimes she feels as if she is going to pass out from coughing to hard. Treatments tried: Mucinex, Robitussion, Delsym, Sudafed. The treatment provided no relief.   Works at [employer]  No known exposures   All first started a month ago   Had a cold or maybe mild flu  Then developed a cough   Now wheezy at times    Coughing some stuff up, notes also     deep bronchial cough    Using benadryl at night     Review of Systems  HENT: Positive for rhinorrhea.   Respiratory: Positive for cough.        Objective:   Physical Exam Alert active no acute distress though impressive cough.  HEENT mild nasal congestion pharynx normal deep bronchial cough no true wheezes no crackles       Assessment & Plan:  Impression subacute bronchitis plan antibiotics prescribed.  Prednisone taper prescribed.  Strongly encouraged to use albuterol 4 times daily.  Symptom care discussed.  Tessalon Perles.

## 2019-02-19 DIAGNOSIS — Z139 Encounter for screening, unspecified: Secondary | ICD-10-CM

## 2019-02-19 LAB — GLUCOSE, POCT (MANUAL RESULT ENTRY): POC Glucose: 92 mg/dl (ref 70–99)

## 2019-02-19 NOTE — Congregational Nurse Program (Signed)
  Dept: [phone]   Congregational Nurse Program Note  Date of Encounter: 02/19/2019  Past Medical History: Past Medical History:  Diagnosis Date  . ADD (attention deficit disorder)   . Asthma   . Constipation   . Depression   . Gastroesophageal reflux     Encounter Details: CNP Questionnaire - 02/19/19 1003      Questionnaire   Patient Status  Not Applicable    Race  White or Caucasian    Location Patient Served At  [facility]  Not Applicable    Uninsured  Uninsured (NEW 1x/quarter)    Food  Within past 12 months, worried food would run out with no money to buy more;Within past 12 months, food ran out with no money to buy more    Housing/Utilities  Yes, have permanent housing    Transportation  No transportation needs    Interpersonal Safety  Yes, feel physically and emotionally safe where you currently live    Medication  Yes, have medication insecurities    Medical Provider  No    Referrals  Medication Assistance;Primary Care Provider/Clinic;[facility]/Care Connects;Behavioral/Mental Health Provider    ED Visit Averted  Not Applicable    Life-Saving Intervention Made  Not Applicable       Client in today to enroll in Care Connect with C. Broadnax. Client lives with a roommate and is currently working Armed forces operational officer at [employer]. She is uninsured. Her previous medical care has been with Dr. Gerda Diss, but client and father states that with no insurance it is becoming difficult to pay out of pocket. Client last seen by Dr. Gerda Diss on 01/06/19 .   Past Medical History: Asthma GERD ADD/ADHD Depression Irritable bowel syndrome Allergic Rhinitis Constipation  Allergies Strattera unkown.Marland KitchenMarland KitchenMood change   Medications: Albuterol inhaler  Client into [facility] today to enroll in [facility] Connect with Ross Marcus.   Client in with her father present. Alert and oriented to person, place and time. Answers appropriately. Appears nervous. Denies  chest pains or shortness of breath. Does report long standing non productive nagging cough. Client clears throat often. Not currently on medications for GERD. Client also reports recent dental pain upper left molar. Some bleeding at night. Client has been using some gargles. RN encouraged client to use warm salt water gargle and spit out and discussed good oral hygiene. No swelling of the face noted today. Client does report difficulty eating. Client denies thoughts of suicide today and no past attempts per client. Client does report increased anxiety attacks, increased stress and grief over recent loss of her mother. Client reports she has not seen a counselor or mental health provider in years. Client's father reports she didn't take her medicines, but has not seen anyone in  A "very long' time. Discussed beginning with our MSW intern and from there discussing further steps. Client agreeable. Possible appointment pending weather today after 3pm. Client agreeable.   Client referral made [facility] with [facility]. Appointment secured for 02/25/19 at 0815 am. Plan to follow up with client after her first appointment. Discussed dental referral with client and father and how that is done after establishing care with primary care provider, the client would be placed on a dental referral wait list. Client and father state understanding. Discussed if client has increased pain, drainage from dental area, swelling and signs of infection to seek medical care.   Francee Nodal RN

## 2019-02-25 ENCOUNTER — Other Ambulatory Visit (HOSPITAL_COMMUNITY)
Admission: RE | Admit: 2019-02-25 | Discharge: 2019-02-25 | Disposition: A | Discharge status: Home / Self Care | Payer: Self-pay | Source: Ambulatory Visit | Attending: Physician Assistant | Admitting: Physician Assistant

## 2019-02-25 ENCOUNTER — Encounter: Payer: Self-pay | Admitting: Physician Assistant

## 2019-02-25 ENCOUNTER — Ambulatory Visit: Payer: Self-pay | Admitting: Physician Assistant

## 2019-02-25 ENCOUNTER — Telehealth: Payer: Self-pay

## 2019-02-25 VITALS — BP 99/65 | HR 58 | Temp 97.7°F | Ht 66.5 in | Wt 176.0 lb

## 2019-02-25 DIAGNOSIS — Z7689 Persons encountering health services in other specified circumstances: Secondary | ICD-10-CM

## 2019-02-25 DIAGNOSIS — K0889 Other specified disorders of teeth and supporting structures: Secondary | ICD-10-CM

## 2019-02-25 DIAGNOSIS — K219 Gastro-esophageal reflux disease without esophagitis: Secondary | ICD-10-CM

## 2019-02-25 DIAGNOSIS — Z13220 Encounter for screening for lipoid disorders: Secondary | ICD-10-CM

## 2019-02-25 DIAGNOSIS — F419 Anxiety disorder, unspecified: Secondary | ICD-10-CM

## 2019-02-25 LAB — COMPREHENSIVE METABOLIC PANEL
ALT: 12 U/L (ref 0–44)
AST: 15 U/L (ref 15–41)
Albumin: 4.2 g/dL (ref 3.5–5.0)
Alkaline Phosphatase: 63 U/L (ref 38–126)
Anion gap: 8 (ref 5–15)
BUN: 16 mg/dL (ref 6–20)
CO2: 23 mmol/L (ref 22–32)
Calcium: 9 mg/dL (ref 8.9–10.3)
Chloride: 108 mmol/L (ref 98–111)
Creatinine, Ser: 0.66 mg/dL (ref 0.44–1.00)
GFR calc Af Amer: >60 mL/min (ref >60)
GFR calc non Af Amer: >60 mL/min (ref >60)
Glucose, Bld: 106 mg/dL — ABNORMAL HIGH (ref 70–99)
Potassium: 4.4 mmol/L (ref 3.5–5.1)
Sodium: 139 mmol/L (ref 135–145)
Total Bilirubin: 0.2 mg/dL — ABNORMAL LOW (ref 0.3–1.2)
Total Protein: 7.4 g/dL (ref 6.5–8.1)

## 2019-02-25 LAB — LIPID PANEL
Cholesterol: 160 mg/dL (ref 0–200)
HDL: 42 mg/dL (ref >40)
LDL Cholesterol: 102 mg/dL — ABNORMAL HIGH (ref 0–99)
Total CHOL/HDL Ratio: 3.8 RATIO
Triglycerides: 80 mg/dL (ref <150)
VLDL: 16 mg/dL (ref 0–40)

## 2019-02-25 MED ORDER — OMEPRAZOLE 40 MG PO CPDR: 40.0000 mg | DELAYED_RELEASE_CAPSULE | Freq: Every day | ORAL | 3 refills | Status: DC

## 2019-02-25 MED ORDER — OMEPRAZOLE 40 MG PO CPDR: 40.0000 mg | DELAYED_RELEASE_CAPSULE | Freq: Every day | ORAL | 1 refills | Status: DC

## 2019-02-25 NOTE — Progress Notes (Signed)
BP 99/65 (BP Location: Right Arm, Patient Position: Sitting, Cuff Size: Normal)   Pulse (!) 58   Temp 97.7 F (36.5 C)   Ht 5' 6.5" (1.689 m)   Wt 176 lb (79.8 kg)   SpO2 99%   BMI 27.98 kg/m    Subjective:    Patient ID: Alison Mcmillan, female    DOB: September 17, 1996, 23 y.o.   MRN: [id number]  HPI: Alison Mcmillan is a 23 y.o. female presenting on 02/25/2019 for New Patient (Initial Visit)   HPI   Pt works at [employer].  Pt was adopted by her grandparents due to her bio mom having drug problems  Pt states having reflux for years and clears her throat chronically.  She was taking medication for it at one time but she didn't like taking the medication for it so she stopped (she says the pills were big).  Epic shows that she saw pediatric GI in 2014-  At that time pt was taking omeprazole and miralax  Pt complains of a tooth problem that has been hurting, bleeding and is sensitive  Pt does have some anxiety but hasn't seen mental health provider yet.    Relevant past medical, surgical, family and social history reviewed and updated as indicated. Interim medical history since our last visit reviewed. Allergies and medications reviewed and updated.   Current Outpatient Medications:  .  albuterol (PROVENTIL HFA;VENTOLIN HFA) 108 (90 Base) MCG/ACT inhaler, Inhale 2 puffs into the lungs every 4 (four) hours as needed., Disp: 1 Inhaler, Rfl: 0 .  norethindrone-ethinyl estradiol (MICROGESTIN,JUNEL,LOESTRIN) 1-20 MG-MCG tablet, Take 1 tablet by mouth daily., Disp: , Rfl:    Review of Systems  Constitutional: Positive for fatigue. Negative for appetite change, chills, diaphoresis, fever and unexpected weight change.  HENT: Positive for dental problem, ear pain and mouth sores. Negative for congestion, drooling, facial swelling, hearing loss, sneezing, sore throat, trouble swallowing and voice change.   Eyes: Negative for pain, discharge, redness, itching and visual  disturbance.  Respiratory: Negative for cough, choking, shortness of breath and wheezing.   Cardiovascular: Negative for chest pain, palpitations and leg swelling.  Gastrointestinal: Negative for abdominal pain, blood in stool, constipation, diarrhea and vomiting.  Endocrine: Negative for cold intolerance, heat intolerance and polydipsia.  Genitourinary: Negative for decreased urine volume, dysuria and hematuria.  Musculoskeletal: Negative for arthralgias, back pain and gait problem.  Skin: Negative for rash.  Allergic/Immunologic: Negative for environmental allergies.  Neurological: Positive for headaches. Negative for seizures, syncope and light-headedness.  Hematological: Negative for adenopathy.  Psychiatric/Behavioral: Positive for dysphoric mood. Negative for agitation and suicidal ideas. The patient is not nervous/anxious.     Per HPI unless specifically indicated above     Objective:    BP 99/65 (BP Location: Right Arm, Patient Position: Sitting, Cuff Size: Normal)   Pulse (!) 58   Temp 97.7 F (36.5 C)   Ht 5' 6.5" (1.689 m)   Wt 176 lb (79.8 kg)   SpO2 99%   BMI 27.98 kg/m   Wt Readings from Last 3 Encounters:  02/25/19 176 lb (79.8 kg)  02/19/19 177 lb (80.3 kg)  01/06/19 177 lb 3.2 oz (80.4 kg)    Physical Exam Vitals signs reviewed.  Constitutional:      Appearance: She is well-developed.     Comments: Pt with frequent throat clearing  HENT:     Head: Normocephalic and atraumatic.     Mouth/Throat:     Dentition: Dental tenderness  present. No dental abscesses.     Pharynx: Oropharynx is clear. Uvula midline. No oropharyngeal exudate.     Comments: Left lower molar with gum growing over half of the tooth.   Eyes:     Conjunctiva/sclera: Conjunctivae normal.     Pupils: Pupils are equal, round, and reactive to light.  Neck:     Musculoskeletal: Neck supple.     Thyroid: No thyromegaly.  Cardiovascular:     Rate and Rhythm: Normal rate and regular rhythm.   Pulmonary:     Effort: Pulmonary effort is normal.     Breath sounds: Normal breath sounds.  Abdominal:     General: Bowel sounds are normal.     Palpations: Abdomen is soft. There is no mass.     Tenderness: There is no abdominal tenderness.  Lymphadenopathy:     Cervical: No cervical adenopathy.  Skin:    General: Skin is warm and dry.  Neurological:     Mental Status: She is alert and oriented to person, place, and time.     Gait: Gait normal.  Psychiatric:        Behavior: Behavior normal.     Results for orders placed or performed in visit on 02/19/19  POCT glucose  Result Value Ref Range   POC Glucose 92 70 - 99 mg/dl      Assessment & Plan:    Encounter Diagnoses  Name Primary?  . Encounter to establish care Yes  . Gastroesophageal reflux disease, esophagitis presence not specified   . Dentalgia   . Anxiety   . Screening cholesterol level     -pt is put on Dental list -pt to restart Omeprazole.  [facility] -will Check lipids -Gave [facility] information to pt so she can get counseling to help with her anxiety -recommended pt apply for family planning medicaid to help with her contraceptive management as this is something that [facility] does not provide -pt to follow up here 1 month to recheck GERD and review labs.   RTO sooner prn

## 2019-02-25 NOTE — Telephone Encounter (Signed)
Called to follow up with client after [facility]. Client states everything went well and she will be starting medications for her acid reflux, omeprazole. She will pick her medicine up from [facility] today. Encouraged client regarding mental health. Discussed again about [facility]. Client was scheduled to come in to see MSW intern last week, but we had to reschedule due to weather. Client still wants to meet with MSW intern first. Client apprehensive about [facility] at this time.  Appointment scheduled for 03/12/19 at 0930 with Reino Bellis MSW [facility].  Client states provider at [facility] will send referral for Dental.  Will continue to follow for Case Management with [facility].   Francee Nodal RN

## 2019-03-04 ENCOUNTER — Encounter: Payer: Self-pay | Admitting: Physician Assistant

## 2019-03-04 ENCOUNTER — Ambulatory Visit: Payer: Self-pay | Admitting: Physician Assistant

## 2019-03-04 VITALS — BP 116/74 | HR 77 | Temp 97.7°F | Ht 66.5 in | Wt 177.5 lb

## 2019-03-04 DIAGNOSIS — R300 Dysuria: Secondary | ICD-10-CM

## 2019-03-04 DIAGNOSIS — N3090 Cystitis, unspecified without hematuria: Secondary | ICD-10-CM

## 2019-03-04 LAB — POCT URINALYSIS DIPSTICK
Bilirubin, UA: NEGATIVE
Glucose, UA: NEGATIVE
Ketones, UA: NEGATIVE
Nitrite, UA: NEGATIVE
Protein, UA: POSITIVE — AB
Spec Grav, UA: 1.025 (ref 1.010–1.025)
Urobilinogen, UA: 0.2 E.U./dL
pH, UA: 6.5 (ref 5.0–8.0)

## 2019-03-04 MED ORDER — SULFAMETHOXAZOLE-TRIMETHOPRIM 800-160 MG PO TABS: 1.0000 | ORAL_TABLET | Freq: Two times a day (BID) | ORAL | 0 refills | Status: DC

## 2019-03-04 NOTE — Patient Instructions (Signed)

## 2019-03-04 NOTE — Progress Notes (Signed)
BP 116/74 (BP Location: Left Arm, Patient Position: Sitting, Cuff Size: Normal)   Pulse 77   Temp 97.7 F (36.5 C) (Other (Comment))   Ht 5' 6.5" (1.689 m)   Wt 177 lb 8 oz (80.5 kg)   SpO2 97%   BMI 28.22 kg/m    Subjective:    Patient ID: Alison Mcmillan, female    DOB: 07/23/1996, 23 y.o.   MRN: [id number]  Alison Mcmillan is a 23 y.o. female presenting on 03/04/2019 for Urinary Tract Infection (c/o urinary frequency, small amounts, burning)   HPI  Symptoms since yesterday.  LMP 2 wk ago.  No fevers  Relevant past medical, surgical, family and social history reviewed and updated as indicated. Interim medical history since our last visit reviewed. Allergies and medications reviewed and updated.  Review of Systems  Constitutional: Positive for fatigue. Negative for appetite change, chills, diaphoresis, fever and unexpected weight change.  HENT: Negative for congestion, dental problem, drooling, ear pain, facial swelling, hearing loss, mouth sores, sneezing, sore throat, trouble swallowing and voice change.   Eyes: Negative for pain, discharge, redness, itching and visual disturbance.  Respiratory: Negative for cough, choking, shortness of breath and wheezing.   Cardiovascular: Negative for chest pain, palpitations and leg swelling.  Gastrointestinal: Positive for abdominal pain. Negative for blood in stool, constipation, diarrhea and vomiting.  Endocrine: Negative for cold intolerance, heat intolerance and polydipsia.  Genitourinary: Positive for decreased urine volume and dysuria. Negative for hematuria.  Musculoskeletal: Negative for arthralgias, back pain and gait problem.  Skin: Negative for rash.  Allergic/Immunologic: Negative for environmental allergies.  Neurological: Negative for seizures, syncope, light-headedness and headaches.  Hematological: Negative for adenopathy.  Psychiatric/Behavioral: Negative for agitation, dysphoric mood and suicidal ideas. The  patient is not nervous/anxious.     Per HPI unless specifically indicated above     Objective:    BP 116/74 (BP Location: Left Arm, Patient Position: Sitting, Cuff Size: Normal)   Pulse 77   Temp 97.7 F (36.5 C) (Other (Comment))   Ht 5' 6.5" (1.689 m)   Wt 177 lb 8 oz (80.5 kg)   SpO2 97%   BMI 28.22 kg/m   Wt Readings from Last 3 Encounters:  03/04/19 177 lb 8 oz (80.5 kg)  02/25/19 176 lb (79.8 kg)  02/19/19 177 lb (80.3 kg)    Physical Exam Vitals signs reviewed.  Constitutional:      Appearance: She is well-developed.  HENT:     Head: Normocephalic and atraumatic.  Neck:     Musculoskeletal: Neck supple.  Cardiovascular:     Rate and Rhythm: Normal rate and regular rhythm.  Pulmonary:     Effort: Pulmonary effort is normal.     Breath sounds: Normal breath sounds.  Abdominal:     General: Bowel sounds are normal.     Palpations: Abdomen is soft. There is no mass.     Tenderness: There is no abdominal tenderness.  Lymphadenopathy:     Cervical: No cervical adenopathy.  Skin:    General: Skin is warm and dry.  Neurological:     Mental Status: She is alert and oriented to person, place, and time.  Psychiatric:        Behavior: Behavior normal.         Assessment & Plan:   Encounter Diagnoses  Name Primary?  . Cystitis Yes  . Dysuria      -rx septra ds -Counseled pt on UTI -pt to follow up  as scheduled.  RTO sooner prn

## 2019-03-26 ENCOUNTER — Ambulatory Visit: Payer: Self-pay | Admitting: Physician Assistant

## 2019-03-26 ENCOUNTER — Encounter: Payer: Self-pay | Admitting: Physician Assistant

## 2019-03-26 DIAGNOSIS — K219 Gastro-esophageal reflux disease without esophagitis: Secondary | ICD-10-CM

## 2019-03-26 DIAGNOSIS — F419 Anxiety disorder, unspecified: Secondary | ICD-10-CM

## 2019-03-26 MED ORDER — ALBUTEROL SULFATE HFA 108 (90 BASE) MCG/ACT IN AERS: 2.0000 | INHALATION_SPRAY | RESPIRATORY_TRACT | 0 refills | Status: DC | PRN

## 2019-03-26 NOTE — Progress Notes (Signed)
   There were no vitals taken for this visit.   Subjective:    Patient ID: Alison Mcmillan, female    DOB: [birth year], 23 y.o.   MRN: [id number]  HPI: Alison Mcmillan is a 23 y.o. female presenting on 03/26/2019 for No chief complaint on file.   HPI  This is a telemedicine visit through [employer] due to the coronavirus pandemic  She is using the omeprazole.   She thinks she has a bit more sputum when she clears her throat than she did when she was in office a month ago.  No SOB. No fever.   She did not contact [facility].  She is still having some anxiety but thinks it is doing better.   Pt says she only uses her inhaler at times, usually when she gets anxiety, but that she needs a refill because hers is out.     Relevant past medical, surgical, family and social history reviewed and updated as indicated. Interim medical history since our last visit reviewed. Allergies and medications reviewed and updated.   Current Outpatient Medications:  .  albuterol (PROVENTIL HFA;VENTOLIN HFA) 108 (90 Base) MCG/ACT inhaler, Inhale 2 puffs into the lungs every 4 (four) hours as needed., Disp: 1 Inhaler, Rfl: 0 .  norethindrone-ethinyl estradiol (MICROGESTIN,JUNEL,LOESTRIN) 1-20 MG-MCG tablet, Take 1 tablet by mouth daily., Disp: , Rfl:  .  omeprazole (PRILOSEC) 40 MG capsule, Take 1 capsule (40 mg total) by mouth daily., Disp: 90 capsule, Rfl: 1   Review of Systems  Per HPI unless specifically indicated above     Objective:    There were no vitals taken for this visit.  Wt Readings from Last 3 Encounters:  03/04/19 177 lb 8 oz (80.5 kg)  02/25/19 176 lb (79.8 kg)  02/19/19 177 lb (80.3 kg)    Physical Exam Constitutional:      Appearance: Normal appearance.  HENT:     Head: Normocephalic and atraumatic.  Pulmonary:     Effort: Pulmonary effort is normal. No respiratory distress.  Neurological:     Mental Status: She is alert and oriented to person, place, and time.  Psychiatric:         Mood and Affect: Mood normal.        Behavior: Behavior normal.     Comments: Pt is talking normally and appropriately.          Assessment & Plan:   Encounter Diagnoses  Name Primary?  . Gastroesophageal reflux disease, esophagitis presence not specified Yes  . Anxiety     -reviewed labs with pt -ordered albuterol MDI from [facility] -encouraged pt to contact [facility] for help with the anxiety -pt to Continue omeprazole -Follow up 57months.  RTO sooner prn

## 2019-05-19 ENCOUNTER — Ambulatory Visit: Payer: Self-pay | Admitting: Physician Assistant

## 2019-05-19 ENCOUNTER — Encounter: Payer: Self-pay | Admitting: Physician Assistant

## 2019-05-19 DIAGNOSIS — K219 Gastro-esophageal reflux disease without esophagitis: Secondary | ICD-10-CM

## 2019-05-19 DIAGNOSIS — F419 Anxiety disorder, unspecified: Secondary | ICD-10-CM

## 2019-05-19 NOTE — Progress Notes (Signed)
There were no vitals taken for this visit.   Subjective:    Patient ID: Alison Mcmillan, female    DOB: Sep 01, 1996, 23 y.o.   MRN: [id number]  HPI: Alison Mcmillan is a 23 y.o. female presenting on 05/19/2019 for No chief complaint on file.   HPI   This is a telemedicine appointment through [employer] due to coronavirus pandemic  I connected with  [name] on 05/19/19 by a video enabled telemedicine application and verified that I am speaking with the correct person using two identifiers.   I discussed the limitations of evaluation and management by telemedicine. The patient expressed understanding and agreed to proceed.  Pt is at work at [employer] and provider is at [employer] is still having anxiety.  She says Her bio mom recently dietd(her grandmother raise/adopted her).  She says she is having a lot more panic attacks lately.  She self treats her panic attacks with her inhaler.  She has still not contacted [facility] as encouraged at several previous office visits.    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  She says she is feeling well and is not having any fevers.  No sob except when has a panic attack.        Relevant past medical, surgical, family and social history reviewed and updated as indicated. Interim medical history since our last visit reviewed. Allergies and medications reviewed and updated.   Current Outpatient Medications:  .  albuterol (PROVENTIL HFA;VENTOLIN HFA) 108 (90 Base) MCG/ACT inhaler, Inhale 2 puffs into the lungs every 4 (four) hours as needed., Disp: 2 Inhaler, Rfl: 0 .  norethindrone-ethinyl estradiol (MICROGESTIN,JUNEL,LOESTRIN) 1-20 MG-MCG tablet, Take 1 tablet by mouth daily., Disp: , Rfl:  .  omeprazole (PRILOSEC) 40 MG capsule, Take 1 capsule (40 mg total) by mouth daily., Disp: 90 capsule, Rfl: 1   Review of Systems   Per HPI unless specifically indicated above     Objective:    There were  no vitals taken for this visit.  Wt Readings from Last 3 Encounters:  03/04/19 177 lb 8 oz (80.5 kg)  02/25/19 176 lb (79.8 kg)  02/19/19 177 lb (80.3 kg)    Physical Exam Constitutional:      General: She is not in acute distress.    Appearance: Normal appearance. She is not ill-appearing.  HENT:     Head: Normocephalic and atraumatic.  Pulmonary:     Effort: Pulmonary effort is normal. No respiratory distress.  Neurological:     Mental Status: She is alert and oriented to person, place, and time.  Psychiatric:        Attention and Perception: Attention normal.        Mood and Affect: Mood normal.        Speech: Speech normal.        Behavior: Behavior is cooperative.           Assessment & Plan:   Encounter Diagnoses  Name Primary?  . Gastroesophageal reflux disease, esophagitis presence not specified Yes  . Anxiety     -encouraged pt to contact [facility] for help with anxiety and panic attacks.  She still has the card that she was given at last OV -pt to continue current medications -pt encouraged to continue to wear mask when she interacts with customers at work and wash her hands frequently -pt to follow up in 3 months.  She is to contact office sooner  prn

## 2019-06-08 ENCOUNTER — Ambulatory Visit: Payer: Self-pay | Admitting: Physician Assistant

## 2019-06-08 ENCOUNTER — Encounter: Payer: Self-pay | Admitting: Physician Assistant

## 2019-06-08 DIAGNOSIS — M7989 Other specified soft tissue disorders: Secondary | ICD-10-CM

## 2019-06-08 NOTE — Progress Notes (Signed)
There were no vitals taken for this visit.   Subjective:    Patient ID: Alison Mcmillan, female    DOB: 03-Nov-1996, 23 y.o.   MRN: [id number]  HPI: Alison Mcmillan is a 23 y.o. female presenting on 06/08/2019 for No chief complaint on file.   HPI   This is a telemedicine visit due to coronavirus pandemic.  It is via Telephone as pt is unanble to get the video on the phone to work today  I connected with  [name] on 06/08/2019 by a video enabled telemedicine application and verified that I am speaking with the correct person using two identifiers.   I discussed the limitations of evaluation and management by telemedicine. The patient expressed understanding and agreed to proceed.   Pt complains of Pain & swelling in toes and some discomfort in the shin area also.  She says this has been Off and on swelling for past several days.  There is no redness or open wound.  The toes are not swelled at the present moment but she says it comes and goes.   Pt at work at [employer] also relates Some nausea. No  Fevers.  LMP 3 wk ago.  She is sexually active and is on OCP.  She has missed a dose or two but then doubled up the next day.    No new shoes for work lately.  She wears Vans.  She wears them to work and when not at work.  She exercise for about 15 minutes once or twice week.      Relevant past medical, surgical, family and social history reviewed and updated as indicated. Interim medical history since our last visit reviewed. Allergies and medications reviewed and updated.   Current Outpatient Medications:  .  albuterol (PROVENTIL HFA;VENTOLIN HFA) 108 (90 Base) MCG/ACT inhaler, Inhale 2 puffs into the lungs every 4 (four) hours as needed., Disp: 2 Inhaler, Rfl: 0 .  norethindrone-ethinyl estradiol (MICROGESTIN,JUNEL,LOESTRIN) 1-20 MG-MCG tablet, Take 1 tablet by mouth daily., Disp: , Rfl:  .  omeprazole (PRILOSEC) 40 MG capsule, Take 1 capsule (40 mg total) by  mouth daily., Disp: 90 capsule, Rfl: 1     Review of Systems  Per HPI unless specifically indicated above     Objective:    There were no vitals taken for this visit.  Wt Readings from Last 3 Encounters:  03/04/19 177 lb 8 oz (80.5 kg)  02/25/19 176 lb (79.8 kg)  02/19/19 177 lb (80.3 kg)    Physical Exam Pulmonary:     Effort: No respiratory distress.  Neurological:     Mental Status: She is alert and oriented to person, place, and time.  Psychiatric:        Speech: Speech normal.        Behavior: Behavior is cooperative.        Cognition and Memory: Cognition normal.         Assessment & Plan:    Encounter Diagnosis  Name Primary?  . Toe swelling Yes    -discussed with pt that she needs to make sure her work shoes fit properly.  Encouraged her to get supportive shoes as Vans are not supportive.  Make sure to wear socks with the shoes.  She can elevate her feet when she gets home from a long shift.  Encouraged smoking cessation -pt to follow up as scheduled.  She is to contact office if swelling worsens, becomes persist or for  any new symptoms like redness or open wound

## 2019-06-23 ENCOUNTER — Telehealth: Payer: Self-pay

## 2019-06-23 ENCOUNTER — Other Ambulatory Visit: Payer: Self-pay

## 2019-06-23 ENCOUNTER — Encounter: Payer: Self-pay | Admitting: Physician Assistant

## 2019-06-23 ENCOUNTER — Ambulatory Visit: Payer: Self-pay | Admitting: Physician Assistant

## 2019-06-23 DIAGNOSIS — Z20822 Contact with and (suspected) exposure to covid-19: Secondary | ICD-10-CM

## 2019-06-23 DIAGNOSIS — R05 Cough: Secondary | ICD-10-CM

## 2019-06-23 DIAGNOSIS — R059 Cough, unspecified: Secondary | ICD-10-CM

## 2019-06-23 DIAGNOSIS — J029 Acute pharyngitis, unspecified: Secondary | ICD-10-CM

## 2019-06-23 DIAGNOSIS — R300 Dysuria: Secondary | ICD-10-CM

## 2019-06-23 DIAGNOSIS — R52 Pain, unspecified: Secondary | ICD-10-CM

## 2019-06-23 NOTE — Progress Notes (Signed)
   There were no vitals taken for this visit.   Subjective:    Patient ID: Alison Mcmillan, female    DOB: 1996/07/04, 23 y.o.   MRN: [id number]  HPI: Alison Mcmillan is a 23 y.o. female presenting on 06/23/2019 for No chief complaint on file.   HPI    This is a telemedicine visit through [employer] due to coronavirus pandemic  I connected with  Alison Mcmillan on 06/23/19 by a video enabled telemedicine application and verified that I am speaking with the correct person using two identifiers.   I discussed the limitations of evaluation and management by telemedicine. The patient expressed understanding and agreed to proceed.  Pt is at work ( at [employer]).  Provider is at office.    Pt started with urinary burning last night.  She says she doesn't thinks she has a fever.  Pt had mono in 2018 and she thinks she has mono again.  She says she feels the same way.  She says her body is starting to hurt.  She says she has white spots on her throat.  She has a sore throat.  Also some coughing.      Relevant past medical, surgical, family and social history reviewed and updated as indicated. Interim medical history since our last visit reviewed. Allergies and medications reviewed and updated.    Current Outpatient Medications:  .  albuterol (PROVENTIL HFA;VENTOLIN HFA) 108 (90 Base) MCG/ACT inhaler, Inhale 2 puffs into the lungs every 4 (four) hours as needed., Disp: 2 Inhaler, Rfl: 0 .  norethindrone-ethinyl estradiol (MICROGESTIN,JUNEL,LOESTRIN) 1-20 MG-MCG tablet, Take 1 tablet by mouth daily., Disp: , Rfl:  .  omeprazole (PRILOSEC) 40 MG capsule, Take 1 capsule (40 mg total) by mouth daily., Disp: 90 capsule, Rfl: 1   Review of Systems  Per HPI unless specifically indicated above     Objective:    There were no vitals taken for this visit.  Wt Readings from Last 3 Encounters:  03/04/19 177 lb 8 oz (80.5 kg)  02/25/19 176 lb (79.8 kg)  02/19/19 177 lb (80.3 kg)    Physical Exam Constitutional:      General: She is not in acute distress.    Appearance: Normal appearance. She is not ill-appearing.  HENT:     Head: Normocephalic and atraumatic.  Pulmonary:     Effort: Pulmonary effort is normal. No respiratory distress.  Neurological:     Mental Status: She is alert and oriented to person, place, and time.  Psychiatric:        Attention and Perception: Attention normal.        Mood and Affect: Mood normal.        Speech: Speech normal.        Behavior: Behavior is cooperative.           Assessment & Plan:    Encounter Diagnoses  Name Primary?  . Sore throat Yes  . Cough   . Body aches   . Dysuria      -discussed with pt that if she feels like she has mono, what she needs is a CV19 test and she agrees.  Discussed that she needs to be out of work until she gets test results  -Pt says her urinary symptoms are not really bad.  Discussed increasing water and she will call office in 2 days if urinary symptoms not improved.

## 2019-06-23 NOTE — Telephone Encounter (Signed)
Soyla Dryer PA with [facility] request COVID 19 test for symptoms. Scheduled for today. Practice # [phone] Fax [phone]

## 2019-06-25 ENCOUNTER — Telehealth: Payer: Self-pay | Admitting: Student

## 2019-06-25 ENCOUNTER — Other Ambulatory Visit: Payer: Self-pay | Admitting: Physician Assistant

## 2019-06-25 LAB — NOVEL CORONAVIRUS, NAA: SARS-CoV-2, NAA: NOT DETECTED

## 2019-06-25 MED ORDER — SULFAMETHOXAZOLE-TRIMETHOPRIM 800-160 MG PO TABS: 1.0000 | ORAL_TABLET | Freq: Two times a day (BID) | ORAL | 0 refills | Status: DC

## 2019-06-25 NOTE — Telephone Encounter (Signed)
Pt's preferred pharmacy is [facility] in [place].  LPN notified PA to send rx to [facility] in [place]. rx sent 06-25-19.   Pt is still waiting on COVID results. Pt states she is self quarantining and not going to work. LPN advised pt to have someone pick up her rx for her. Pt verbalized understanding.

## 2019-07-01 ENCOUNTER — Telehealth: Payer: Self-pay | Admitting: Student

## 2019-07-01 NOTE — Telephone Encounter (Signed)
LPN called patient back and notified her that PA denied pt's request to be tested due to is unlikely to get mono twice and the test could still be positive from last time she has it. PA advises that her symptoms are most likely due to a viral sore throat or allergies. Pt was advised to do warm salt water gargles, take otc antihistamine, get plenty of rest, drink plenty of fluids, and take IBU and / or apap prn. Pt verbalized understanding.

## 2019-08-25 ENCOUNTER — Encounter: Payer: Self-pay | Admitting: Physician Assistant

## 2019-08-25 ENCOUNTER — Ambulatory Visit: Payer: Self-pay | Admitting: Physician Assistant

## 2019-08-25 DIAGNOSIS — F419 Anxiety disorder, unspecified: Secondary | ICD-10-CM

## 2019-08-25 DIAGNOSIS — K219 Gastro-esophageal reflux disease without esophagitis: Secondary | ICD-10-CM

## 2019-08-25 DIAGNOSIS — F329 Major depressive disorder, single episode, unspecified: Secondary | ICD-10-CM

## 2019-08-25 DIAGNOSIS — F32A Depression, unspecified: Secondary | ICD-10-CM

## 2019-08-25 MED ORDER — PANTOPRAZOLE SODIUM 40 MG PO TBEC: 40.0000 mg | DELAYED_RELEASE_TABLET | Freq: Every day | ORAL | 3 refills | Status: DC

## 2019-08-25 MED ORDER — CITALOPRAM HYDROBROMIDE 20 MG PO TABS: 20.0000 mg | ORAL_TABLET | Freq: Every day | ORAL | 0 refills | Status: DC

## 2019-08-25 NOTE — Progress Notes (Signed)
There were no vitals taken for this visit.   Subjective:    Patient ID: Alison Mcmillan, female    DOB: 19-Oct-1996, 23 y.o.   MRN: [id number]  HPI: Alison Mcmillan is a 23 y.o. female presenting on 08/25/2019 for Gastroesophageal Reflux and Anxiety   HPI  This is a telemedicine appointment through [employer] due to coronavirus pandemic  I connected with  Alison Mcmillan on 08/25/19 by a video enabled telemedicine application and verified that I am speaking with the correct person using two identifiers.   I discussed the limitations of evaluation and management by telemedicine. The patient expressed understanding and agreed to proceed.  Pt is at home.  Provider is at office.    Pt says her Reflux is doing good.   But then she says the omeprazole only helps a little bit.  She says she is watching what she eats.  She states she is getting a little burning and a little cough/throat clearing.    She requests to be given protonix to replace the omeprazole.    She says her anxiety isn't well.  She didn't contact [facility] as recomended.  She is having some depression.  She is still working.  No SI or HI.    She feels anxious all of the time.  She says she has not felt well since her bio mom died.  She isn't sleeping.    She has never been on medications for anxiety or depression in the past.    She is still working at [employer] and says that is going well.  She is not working today.    Relevant past medical, surgical, family and social history reviewed and updated as indicated. Interim medical history since our last visit reviewed. Allergies and medications reviewed and updated.   Current Outpatient Medications:  .  albuterol (PROVENTIL HFA;VENTOLIN HFA) 108 (90 Base) MCG/ACT inhaler, Inhale 2 puffs into the lungs every 4 (four) hours as needed., Disp: 2 Inhaler, Rfl: 0 .  norethindrone-ethinyl estradiol (MICROGESTIN,JUNEL,LOESTRIN) 1-20 MG-MCG tablet, Take 1 tablet by mouth  daily., Disp: , Rfl:  .  omeprazole (PRILOSEC) 40 MG capsule, Take 1 capsule (40 mg total) by mouth daily., Disp: 90 capsule, Rfl: 1   Review of Systems  Per HPI unless specifically indicated above     Objective:    There were no vitals taken for this visit.  Wt Readings from Last 3 Encounters:  03/04/19 177 lb 8 oz (80.5 kg)  02/25/19 176 lb (79.8 kg)  02/19/19 177 lb (80.3 kg)    Physical Exam Constitutional:      General: She is not in acute distress.    Appearance: She is not ill-appearing.  HENT:     Head: Normocephalic and atraumatic.  Pulmonary:     Effort: Pulmonary effort is normal. No respiratory distress.  Neurological:     Mental Status: She is alert and oriented to person, place, and time.  Psychiatric:        Attention and Perception: Attention normal.        Speech: Speech normal.        Behavior: Behavior is cooperative.        Thought Content: Thought content normal.           Assessment & Plan:    Encounter Diagnoses  Name Primary?  . Gastroesophageal reflux disease, esophagitis presence not specified Yes  . Anxiety and depression     -rx citalopram -Pt to contact  [facility] for counseling and management on anxiety and depression -rx protonix per pt request -Pt to follow up 2-3 week to recheck anxiety/depression (telemedicine appointment)

## 2019-09-09 ENCOUNTER — Ambulatory Visit: Payer: Self-pay | Admitting: Physician Assistant

## 2019-09-09 ENCOUNTER — Encounter: Payer: Self-pay | Admitting: Physician Assistant

## 2019-09-09 DIAGNOSIS — F32A Depression, unspecified: Secondary | ICD-10-CM

## 2019-09-09 DIAGNOSIS — F329 Major depressive disorder, single episode, unspecified: Secondary | ICD-10-CM

## 2019-09-09 DIAGNOSIS — F419 Anxiety disorder, unspecified: Secondary | ICD-10-CM

## 2019-09-09 MED ORDER — CITALOPRAM HYDROBROMIDE 20 MG PO TABS: 20.0000 mg | ORAL_TABLET | Freq: Every day | ORAL | 0 refills | Status: DC

## 2019-09-09 NOTE — Progress Notes (Signed)
   There were no vitals taken for this visit.   Subjective:    Patient ID: Alison Mcmillan, female    DOB: 12-07-1996, 23 y.o.   MRN: [id number]  HPI: Alison Mcmillan is a 23 y.o. female presenting on 09/09/2019 for No chief complaint on file.   HPI  This is a telemedicine appointment through [employer] due to coronavirus pandemic  I connected with  Alison Mcmillan on 09/09/19 by a video enabled telemedicine application and verified that I am speaking with the correct person using two identifiers.   I discussed the limitations of evaluation and management by telemedicine. The patient expressed understanding and agreed to proceed.  Pt is at home.  Provider is at office    She did not contact [facility] because she didn't want to "get close" to a counselor and "then have them leave".  She says that happened in the past.    She is not changed much or noticed any difference with the citalopram   Still some anxiety and depression.  No SI or HI.   She is still working at the [employer].    Relevant past medical, surgical, family and social history reviewed and updated as indicated. Interim medical history since our last visit reviewed. Allergies and medications reviewed and updated.   Current Outpatient Medications:  .  albuterol (PROVENTIL HFA;VENTOLIN HFA) 108 (90 Base) MCG/ACT inhaler, Inhale 2 puffs into the lungs every 4 (four) hours as needed., Disp: 2 Inhaler, Rfl: 0 .  citalopram (CELEXA) 20 MG tablet, Take 1 tablet (20 mg total) by mouth daily., Disp: 30 tablet, Rfl: 0 .  norethindrone-ethinyl estradiol (MICROGESTIN,JUNEL,LOESTRIN) 1-20 MG-MCG tablet, Take 1 tablet by mouth daily., Disp: , Rfl:  .  pantoprazole (PROTONIX) 40 MG tablet, Take 1 tablet (40 mg total) by mouth daily., Disp: 30 tablet, Rfl: 3   Review of Systems  Per HPI unless specifically indicated above     Objective:    There were no vitals taken for this visit.  Wt Readings from Last 3 Encounters:   03/04/19 177 lb 8 oz (80.5 kg)  02/25/19 176 lb (79.8 kg)  02/19/19 177 lb (80.3 kg)    Physical Exam Constitutional:      General: She is not in acute distress.    Appearance: Normal appearance. She is not ill-appearing.  HENT:     Head: Normocephalic and atraumatic.  Pulmonary:     Effort: Pulmonary effort is normal. No respiratory distress.  Neurological:     Mental Status: She is alert and oriented to person, place, and time.  Psychiatric:        Attention and Perception: Attention normal.        Mood and Affect: Affect normal.        Speech: Speech normal.        Behavior: Behavior is cooperative.         Assessment & Plan:    Encounter Diagnosis  Name Primary?  Marland Kitchen Anxiety and depression Yes     -pt to Continue citalopram  -pt to Contact [facility] for counseling -pt to follow up  3 wk.  She is to contact office sooner prn

## 2019-09-29 ENCOUNTER — Encounter (HOSPITAL_COMMUNITY): Payer: Self-pay

## 2019-09-29 ENCOUNTER — Other Ambulatory Visit: Payer: Self-pay

## 2019-09-29 ENCOUNTER — Emergency Department (HOSPITAL_COMMUNITY)
Admission: EM | Admit: 2019-09-29 | Discharge: 2019-09-29 | Disposition: A | Discharge status: Home / Self Care | Payer: Self-pay | Attending: Emergency Medicine | Admitting: Emergency Medicine

## 2019-09-29 DIAGNOSIS — J020 Streptococcal pharyngitis: Secondary | ICD-10-CM | POA: Insufficient documentation

## 2019-09-29 DIAGNOSIS — J45909 Unspecified asthma, uncomplicated: Secondary | ICD-10-CM | POA: Insufficient documentation

## 2019-09-29 DIAGNOSIS — Z87891 Personal history of nicotine dependence: Secondary | ICD-10-CM | POA: Insufficient documentation

## 2019-09-29 LAB — GROUP A STREP BY PCR: Group A Strep by PCR: DETECTED — AB

## 2019-09-29 MED ORDER — PENICILLIN V POTASSIUM 250 MG PO TABS
500.0000 mg | ORAL_TABLET | Freq: Once | ORAL | Status: AC
  Administered 2019-09-29: 500 mg via ORAL
  Filled 2019-09-29: qty 2

## 2019-09-29 MED ORDER — DEXAMETHASONE 4 MG PO TABS
6.0000 mg | ORAL_TABLET | Freq: Once | ORAL | Status: AC
  Administered 2019-09-29: 6 mg via ORAL
  Filled 2019-09-29: qty 2

## 2019-09-29 MED ORDER — PENICILLIN V POTASSIUM 500 MG PO TABS: 500.0000 mg | ORAL_TABLET | Freq: Four times a day (QID) | ORAL | 0 refills | Status: AC

## 2019-09-29 NOTE — ED Triage Notes (Signed)
Pt woke up this morning with a sore throat. Tonsils swollen and red. Afebrile.

## 2019-09-29 NOTE — Discharge Instructions (Addendum)
You were seen in the emergency department for a painful throat.  You tested positive for strep throat.  She do warm salt water gargles and Tylenol and ibuprofen for pain.  Please finish your antibiotics.  Follow-up with your doctor return if any worsening symptoms.

## 2019-09-29 NOTE — ED Provider Notes (Signed)
[facility] EMERGENCY DEPARTMENT Provider Note   CSN: [id number] Arrival date & time: 09/29/19  2671     History   Chief Complaint Chief Complaint  Patient presents with  . Sore Throat    HPI Alison Mcmillan is a 23 y.o. female.  She is presenting with severe throat pain that began this morning.  No known fevers or chills.  No cough no shortness of breath.  No runny nose.  No sick contacts or recent travel.  She said she took some ibuprofen without any improvement.     The history is provided by the patient.  Sore Throat This is a new problem. The current episode started 1 to 2 hours ago. The problem occurs constantly. The problem has not changed since onset.Pertinent negatives include no chest pain, no abdominal pain, no headaches and no shortness of breath. The symptoms are aggravated by swallowing. Nothing relieves the symptoms. She has tried nothing for the symptoms. The treatment provided no relief.    Past Medical History:  Diagnosis Date  . ADD (attention deficit disorder)   . Anxiety   . Asthma   . Constipation   . Depression   . Gastroesophageal reflux     Patient Active Problem List   Diagnosis Date Noted  . Abnormal uterine bleeding 06/01/2016  . Irritable bowel syndrome with diarrhea 05/21/2016  . ADHD (attention deficit hyperactivity disorder) 01/20/2014  . ADD (attention deficit disorder) 11/24/2013  . Depression 11/24/2013  . Allergic rhinitis 11/18/2013  . Asthma, chronic 11/04/2013  . Family history of diabetes mellitus 01/20/2013  . Epigastric abdominal pain 01/07/2013  . Gastroesophageal reflux   . Simple constipation     History reviewed. No pertinent surgical history.   OB History   No obstetric history on file.      Home Medications    Prior to Admission medications   Medication Sig Start Date End Date Taking? Authorizing Provider  albuterol (PROVENTIL HFA;VENTOLIN HFA) 108 (90 Base) MCG/ACT inhaler Inhale 2 puffs into the lungs  every 4 (four) hours as needed. 03/26/19   Jacquelin Hawking, PA-C  citalopram (CELEXA) 20 MG tablet Take 1 tablet (20 mg total) by mouth daily. 09/09/19   Jacquelin Hawking, PA-C  norethindrone-ethinyl estradiol (MICROGESTIN,JUNEL,LOESTRIN) 1-20 MG-MCG tablet Take 1 tablet by mouth daily.    [provider]  pantoprazole (PROTONIX) 40 MG tablet Take 1 tablet (40 mg total) by mouth daily. 08/25/19   Jacquelin Hawking, PA-C    Family History Family History  Adopted: Yes  Problem Relation Age of Onset  . Arthritis Sister   . Depression Sister   . ADD / ADHD Brother   . Drug abuse Mother   . Cholelithiasis Maternal Grandmother   . Bipolar disorder Maternal Grandmother   . Depression Maternal Grandmother   . Heart disease Maternal Grandmother     Social History Social History   Tobacco Use  . Smoking status: Former Smoker    Packs/day: 0.50    Years: 1.00    Pack years: 0.50    Quit date: 06/2019    Years since quitting: 0.3  . Smokeless tobacco: Never Used  Substance Use Topics  . Alcohol use: No  . Drug use: No     Allergies   Strattera [atomoxetine hcl]   Review of Systems Review of Systems  Constitutional: Negative for fever.  HENT: Positive for sore throat and voice change. Negative for ear pain.   Eyes: Negative for visual disturbance.  Respiratory: Negative for shortness  of breath.   Cardiovascular: Negative for chest pain.  Gastrointestinal: Negative for abdominal pain.  Genitourinary: Negative for dysuria.  Musculoskeletal: Positive for neck pain.  Skin: Negative for rash.  Neurological: Negative for headaches.     Physical Exam Updated Vital Signs BP 132/87 (BP Location: Right Arm)   Pulse 79   Temp 98.2 F (36.8 C) (Oral)   Resp 15   Ht 5\' 4"  (1.626 m)   Wt 77.1 kg   LMP 09/22/2019   SpO2 98%   BMI 29.18 kg/m   Physical Exam Vitals signs and nursing note reviewed.  Constitutional:      General: She is not in acute distress.     Appearance: She is well-developed.  HENT:     Head: Normocephalic and atraumatic.     Nose: Congestion present.     Mouth/Throat:     Mouth: Mucous membranes are moist.     Pharynx: Uvula midline. Posterior oropharyngeal erythema present. No uvula swelling.     Tonsils: Tonsillar exudate present. No tonsillar abscesses.  Eyes:     Conjunctiva/sclera: Conjunctivae normal.  Neck:     Musculoskeletal: Neck supple.  Cardiovascular:     Rate and Rhythm: Normal rate and regular rhythm.     Heart sounds: No murmur.  Pulmonary:     Effort: Pulmonary effort is normal. No respiratory distress.     Breath sounds: Normal breath sounds.  Abdominal:     Palpations: Abdomen is soft.     Tenderness: There is no abdominal tenderness.  Skin:    General: Skin is warm and dry.     Capillary Refill: Capillary refill takes less than 2 seconds.  Neurological:     General: No focal deficit present.     Mental Status: She is alert.      ED Treatments / Results  Labs (all labs ordered are listed, but only abnormal results are displayed) Labs Reviewed  GROUP A STREP BY PCR - Abnormal; Notable for the following components:      Result Value   Group A Strep by PCR DETECTED (*)    All other components within normal limits    EKG None  Radiology No results found.  Procedures Procedures (including critical care time)  Medications Ordered in ED Medications  dexamethasone (DECADRON) tablet 6 mg (6 mg Oral Given 09/29/19 0928)  penicillin v potassium (VEETID) tablet 500 mg (500 mg Oral Given 09/29/19 [id number])     Initial Impression / Assessment and Plan / ED Course  I have reviewed the triage vital signs and the nursing notes.  Pertinent labs & imaging results that were available during my care of the patient were reviewed by me and considered in my medical decision making (see chart for details).  Clinical Course as of Sep 28 1634  Tue Sep 29, 2019  [id number] 23 year old female with 1 day of sore  throat.  Differential includes pharyngitis, strep, peritonsillar abscess, less likely epiglottitis, Covid.  She does not endorse any other symptoms other than a sore throat.  She has some erythema posteriorly and has a strep smell to her but no exudate noted.  Uvula midline with symmetric tonsillar pillars.   [MB]  [id number] Patient is rapid strep positive.  Will give a dose of Decadron and some antibiotics and sent her home with a prescription.  Reviewed this with the patient she is comfortable with plan.   [MB]    Clinical Course User Index [MB] Terrilee FilesButler, Bentley Fissel C, MD  Alison Mcmillan was evaluated in Emergency Department on 09/29/2019 for the symptoms described in the history of present illness. She was evaluated in the context of the global COVID-19 pandemic, which necessitated consideration that the patient might be at risk for infection with the SARS-CoV-2 virus that causes COVID-19. Institutional protocols and algorithms that pertain to the evaluation of patients at risk for COVID-19 are in a state of rapid change based on information released by regulatory bodies including the CDC and federal and state organizations. These policies and algorithms were followed during the patient's care in the ED.   Final Clinical Impressions(s) / ED Diagnoses   Final diagnoses:  Strep throat    ED Discharge Orders         Ordered    penicillin v potassium (VEETID) 500 MG tablet  4 times daily     09/29/19 0922           Hayden Rasmussen, MD 09/29/19 [phone]

## 2019-09-29 NOTE — ED Notes (Signed)
Throat swollen and red. Painful swallowing

## 2019-09-30 ENCOUNTER — Ambulatory Visit: Payer: Self-pay | Admitting: Physician Assistant

## 2019-10-08 ENCOUNTER — Ambulatory Visit: Payer: Self-pay | Admitting: Physician Assistant

## 2019-10-08 ENCOUNTER — Encounter: Payer: Self-pay | Admitting: Physician Assistant

## 2019-10-08 DIAGNOSIS — J029 Acute pharyngitis, unspecified: Secondary | ICD-10-CM

## 2019-10-08 DIAGNOSIS — F419 Anxiety disorder, unspecified: Secondary | ICD-10-CM

## 2019-10-08 DIAGNOSIS — F329 Major depressive disorder, single episode, unspecified: Secondary | ICD-10-CM

## 2019-10-08 DIAGNOSIS — F32A Depression, unspecified: Secondary | ICD-10-CM

## 2019-10-08 MED ORDER — ALBUTEROL SULFATE HFA 108 (90 BASE) MCG/ACT IN AERS: 2.0000 | INHALATION_SPRAY | RESPIRATORY_TRACT | 0 refills | Status: DC | PRN

## 2019-10-08 MED ORDER — FLUOXETINE HCL 20 MG PO CAPS: 20.0000 mg | ORAL_CAPSULE | Freq: Every day | ORAL | 0 refills | Status: DC

## 2019-10-08 NOTE — Progress Notes (Signed)
LMP 09/22/2019    Subjective:    Patient ID: Alison Mcmillan, female    DOB: 04-23-1996, 23 y.o.   MRN: [id number]  HPI: Alison Mcmillan is a 23 y.o. female presenting on 10/08/2019 for No chief complaint on file.   HPI   This is a telemedicine appointment through [employer] due to coronavirus pandemic  I connected with  Alison Mcmillan on 10/08/2019  by a video enabled telemedicine application and verified that I am speaking with the correct person using two identifiers.   I discussed the limitations of evaluation and management by telemedicine. The patient expressed understanding and agreed to proceed.  Pt is at work.  Provider is at office.     She is still working at [employer].  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  She was diagnosed with strep throat in the ER.  She is still taking her PCN  Pt is off her citalopram due to $$.  She denies SI, HI.  She says she felt better on the medication.   She is also off her OCP.   She is signed up for [employer] medication assistance program.     Relevant past medical, surgical, family and social history reviewed and updated as indicated. Interim medical history since our last visit reviewed. Allergies and medications reviewed and updated.   Current Outpatient Medications:  .  albuterol (PROVENTIL HFA;VENTOLIN HFA) 108 (90 Base) MCG/ACT inhaler, Inhale 2 puffs into the lungs every 4 (four) hours as needed., Disp: 2 Inhaler, Rfl: 0 .  pantoprazole (PROTONIX) 40 MG tablet, Take 1 tablet (40 mg total) by mouth daily., Disp: 30 tablet, Rfl: 3 .  penicillin v potassium (VEETID) 500 MG tablet, Take 1 tablet (500 mg total) by mouth 4 (four) times daily for 10 days., Disp: 40 tablet, Rfl: 0 .  citalopram (CELEXA) 20 MG tablet, Take 1 tablet (20 mg total) by mouth daily. (Patient not taking: Reported on 10/08/2019), Disp: 30 tablet, Rfl: 0 .  norethindrone-ethinyl estradiol  (MICROGESTIN,JUNEL,LOESTRIN) 1-20 MG-MCG tablet, Take 1 tablet by mouth daily., Disp: , Rfl:     Review of Systems  Per HPI unless specifically indicated above     Objective:    LMP 09/22/2019   Wt Readings from Last 3 Encounters:  09/29/19 170 lb (77.1 kg)  03/04/19 177 lb 8 oz (80.5 kg)  02/25/19 176 lb (79.8 kg)    Physical Exam Constitutional:      General: She is not in acute distress.    Appearance: Normal appearance. She is not ill-appearing.  HENT:     Head: Normocephalic and atraumatic.  Pulmonary:     Effort: No respiratory distress.  Neurological:     Mental Status: She is alert and oriented to person, place, and time.  Psychiatric:        Attention and Perception: Attention normal.        Speech: Speech normal.        Behavior: Behavior is cooperative.         Assessment & Plan:     Encounter Diagnoses  Name Primary?  Marland Kitchen Anxiety and depression Yes  . Sore throat     -will try pt on fluoxetine as it is available at no cost through [facility].  Pt is in agreement with plan.    -offered pt IUD which we have samples of but she declines.  She is counseled to use condoms during intercourse  to reduce pregnancy risk  -will follow up in 3-4 weeks to see how she is doing on the new medication. She is encouraged to finish her antibiotics.  She is to contact office sooner prn

## 2019-10-14 ENCOUNTER — Ambulatory Visit: Payer: Self-pay | Admitting: Physician Assistant

## 2019-10-14 ENCOUNTER — Other Ambulatory Visit: Payer: Self-pay | Admitting: *Deleted

## 2019-10-14 ENCOUNTER — Encounter: Payer: Self-pay | Admitting: Physician Assistant

## 2019-10-14 DIAGNOSIS — Z20822 Contact with and (suspected) exposure to covid-19: Secondary | ICD-10-CM

## 2019-10-14 DIAGNOSIS — J029 Acute pharyngitis, unspecified: Secondary | ICD-10-CM

## 2019-10-14 DIAGNOSIS — R05 Cough: Secondary | ICD-10-CM

## 2019-10-14 DIAGNOSIS — R059 Cough, unspecified: Secondary | ICD-10-CM

## 2019-10-14 MED ORDER — AMOXICILLIN-POT CLAVULANATE 875-125 MG PO TABS: 1.0000 | ORAL_TABLET | Freq: Two times a day (BID) | ORAL | 0 refills | Status: DC

## 2019-10-14 MED ORDER — FLUCONAZOLE 150 MG PO TABS: 150.0000 mg | ORAL_TABLET | Freq: Once | ORAL | 0 refills | Status: AC

## 2019-10-14 NOTE — Progress Notes (Signed)
LMP 09/22/2019    Subjective:    Patient ID: Alison Mcmillan, female    DOB: March 01, 1996, 23 y.o.   MRN: [id number]  HPI: Alison Mcmillan is a 23 y.o. female presenting on 10/14/2019 for No chief complaint on file.   HPI   This is a telemedicine appointment through [employer] due to coronavirus pandemic  I connected with  Alison Mcmillan on 10/14/19 by a video enabled telemedicine application and verified that I am speaking with the correct person using two identifiers.   I discussed the limitations of evaluation and management by telemedicine. The patient expressed understanding and agreed to proceed.  Pt is at home.  Provider is at office.     Pt is sick.    She was dx with strep 9/29 in ER,  She took all her meds (PCN 500 qid x 10 days)  and got well.  She got sick again started yesterday.    Symptoms now- throat hurts.  No fever.  Some earache bilateral.  Hurts every time she drinks.  A little bit of cough.  No body aches.    Also vag itch and discharge   - started yesterday-     Relevant past medical, surgical, family and social history reviewed and updated as indicated. Interim medical history since our last visit reviewed. Allergies and medications reviewed and updated.   Current Outpatient Medications:  .  albuterol (VENTOLIN HFA) 108 (90 Base) MCG/ACT inhaler, Inhale 2 puffs into the lungs every 4 (four) hours as needed., Disp: 18 g, Rfl: 0 .  FLUoxetine (PROZAC) 20 MG capsule, Take 1 capsule (20 mg total) by mouth daily., Disp: 90 capsule, Rfl: 0 .  pantoprazole (PROTONIX) 40 MG tablet, Take 1 tablet (40 mg total) by mouth daily., Disp: 30 tablet, Rfl: 3 .  norethindrone-ethinyl estradiol (MICROGESTIN,JUNEL,LOESTRIN) 1-20 MG-MCG tablet, Take 1 tablet by mouth daily., Disp: , Rfl:      Review of Systems  Per HPI unless specifically indicated above     Objective:    LMP 09/22/2019   Wt Readings from Last 3 Encounters:  09/29/19 170 lb (77.1 kg)  03/04/19  177 lb 8 oz (80.5 kg)  02/25/19 176 lb (79.8 kg)    Physical Exam Constitutional:      General: She is not in acute distress.    Appearance: She is not toxic-appearing.  HENT:     Head: Normocephalic and atraumatic.  Pulmonary:     Effort: No respiratory distress.  Neurological:     Mental Status: She is alert and oriented to person, place, and time.  Psychiatric:        Attention and Perception: Attention normal.        Behavior: Behavior is cooperative.     Comments: Voice slightly hoarse     Results for orders placed or performed during the hospital encounter of 09/29/19  Group A Strep by PCR   Specimen: Throat; Sterile Swab  Result Value Ref Range   Group A Strep by PCR DETECTED (A) NOT DETECTED      Assessment & Plan:   Encounter Diagnoses  Name Primary?  . Sore throat Yes  . Cough   . Suspected 2019 novel coronavirus infection     -will check covid test -rx augmentin (to [facility] [place]- she is to use drive thru window and NOT go inside [facility]).  Will not retest strep at this time due to covid test pending and risk -will Fax note for work- pt  to call back with fax number -pt to follow up as scheduled.  She is to contact office sooner if she fails to get better or if she has any worsening or new symptoms.

## 2019-10-16 LAB — NOVEL CORONAVIRUS, NAA: SARS-CoV-2, NAA: NOT DETECTED

## 2019-11-10 ENCOUNTER — Ambulatory Visit: Payer: Self-pay | Admitting: Physician Assistant

## 2019-11-21 ENCOUNTER — Encounter (HOSPITAL_COMMUNITY): Payer: Self-pay | Admitting: Emergency Medicine

## 2019-11-21 ENCOUNTER — Emergency Department (HOSPITAL_COMMUNITY)
Admission: EM | Admit: 2019-11-21 | Discharge: 2019-11-22 | Disposition: A | Discharge status: Home / Self Care | Payer: Self-pay | Attending: Emergency Medicine | Admitting: Emergency Medicine

## 2019-11-21 ENCOUNTER — Other Ambulatory Visit: Payer: Self-pay

## 2019-11-21 DIAGNOSIS — R102 Pelvic and perineal pain: Secondary | ICD-10-CM | POA: Insufficient documentation

## 2019-11-21 DIAGNOSIS — F17210 Nicotine dependence, cigarettes, uncomplicated: Secondary | ICD-10-CM | POA: Insufficient documentation

## 2019-11-21 DIAGNOSIS — N939 Abnormal uterine and vaginal bleeding, unspecified: Secondary | ICD-10-CM | POA: Insufficient documentation

## 2019-11-21 DIAGNOSIS — N72 Inflammatory disease of cervix uteri: Secondary | ICD-10-CM | POA: Insufficient documentation

## 2019-11-21 LAB — URINALYSIS, ROUTINE W REFLEX MICROSCOPIC
Bacteria, UA: NONE SEEN
Bilirubin Urine: NEGATIVE
Glucose, UA: NEGATIVE mg/dL
Ketones, ur: NEGATIVE mg/dL
Nitrite: NEGATIVE
Protein, ur: 30 mg/dL — AB
Specific Gravity, Urine: 1.029 (ref 1.005–1.030)
pH: 6 (ref 5.0–8.0)

## 2019-11-21 LAB — COMPREHENSIVE METABOLIC PANEL
ALT: 19 U/L (ref 0–44)
AST: 22 U/L (ref 15–41)
Albumin: 4.7 g/dL (ref 3.5–5.0)
Alkaline Phosphatase: 72 U/L (ref 38–126)
Anion gap: 12 (ref 5–15)
BUN: 13 mg/dL (ref 6–20)
CO2: 24 mmol/L (ref 22–32)
Calcium: 9.3 mg/dL (ref 8.9–10.3)
Chloride: 101 mmol/L (ref 98–111)
Creatinine, Ser: 0.64 mg/dL (ref 0.44–1.00)
GFR calc Af Amer: >60 mL/min (ref >60)
GFR calc non Af Amer: >60 mL/min (ref >60)
Glucose, Bld: 95 mg/dL (ref 70–99)
Potassium: 3.5 mmol/L (ref 3.5–5.1)
Sodium: 137 mmol/L (ref 135–145)
Total Bilirubin: 0.5 mg/dL (ref 0.3–1.2)
Total Protein: 8.8 g/dL — ABNORMAL HIGH (ref 6.5–8.1)

## 2019-11-21 LAB — WET PREP, GENITAL
Clue Cells Wet Prep HPF POC: NONE SEEN
Sperm: NONE SEEN
Trich, Wet Prep: NONE SEEN
Yeast Wet Prep HPF POC: NONE SEEN

## 2019-11-21 LAB — LIPASE, BLOOD: Lipase: 36 U/L (ref 11–51)

## 2019-11-21 LAB — CBC
HCT: 42.3 % (ref 36.0–46.0)
Hemoglobin: 13.9 g/dL (ref 12.0–15.0)
MCH: 29.3 pg (ref 26.0–34.0)
MCHC: 32.9 g/dL (ref 30.0–36.0)
MCV: 89.1 fL (ref 80.0–100.0)
Platelets: 370 10*3/uL (ref 150–400)
RBC: 4.75 MIL/uL (ref 3.87–5.11)
RDW: 13.3 % (ref 11.5–15.5)
WBC: 12 10*3/uL — ABNORMAL HIGH (ref 4.0–10.5)
nRBC: 0 % (ref 0.0–0.2)

## 2019-11-21 LAB — PREGNANCY, URINE: Preg Test, Ur: NEGATIVE

## 2019-11-21 NOTE — ED Notes (Signed)
Awaiting eval  

## 2019-11-21 NOTE — ED Triage Notes (Signed)
Pt c/o lower abdominal pain and vaginal discharge x 2 weeks.

## 2019-11-21 NOTE — ED Notes (Signed)
Pt ambulates with an easy gait, no guarding to room

## 2019-11-21 NOTE — ED Provider Notes (Signed)
[facility] EMERGENCY DEPARTMENT Provider Note   CSN: [id number] Arrival date & time: 11/21/19  2130     History   Chief Complaint Chief Complaint  Patient presents with  . Abdominal Pain    HPI Alison Mcmillan is a 23 y.o. female.     Patient presents to the emergency department for evaluation of lower abdominal and pelvic discomfort has been ongoing for 2 weeks.  She has been experiencing a reddish discharge over this period of time as well.  She did have unprotected sex around the time of onset of symptoms.  No urinary symptoms.  No fever.  No nausea, vomiting, diarrhea.     Past Medical History:  Diagnosis Date  . ADD (attention deficit disorder)   . Anxiety   . Asthma   . Constipation   . Depression   . Gastroesophageal reflux     Patient Active Problem List   Diagnosis Date Noted  . Abnormal uterine bleeding 06/01/2016  . Irritable bowel syndrome with diarrhea 05/21/2016  . ADHD (attention deficit hyperactivity disorder) 01/20/2014  . ADD (attention deficit disorder) 11/24/2013  . Depression 11/24/2013  . Allergic rhinitis 11/18/2013  . Asthma, chronic 11/04/2013  . Family history of diabetes mellitus 01/20/2013  . Epigastric abdominal pain 01/07/2013  . Gastroesophageal reflux   . Simple constipation     History reviewed. No pertinent surgical history.   OB History   No obstetric history on file.      Home Medications    Prior to Admission medications   Medication Sig Start Date End Date Taking? Authorizing Provider  albuterol (VENTOLIN HFA) 108 (90 Base) MCG/ACT inhaler Inhale 2 puffs into the lungs every 4 (four) hours as needed. 10/08/19  Yes Jacquelin Hawking, PA-C  pantoprazole (PROTONIX) 40 MG tablet Take 1 tablet (40 mg total) by mouth daily. Patient taking differently: Take 40 mg by mouth daily as needed (for acid reflux).  08/25/19  Yes Jacquelin Hawking, PA-C  amoxicillin-clavulanate (AUGMENTIN) 875-125 MG tablet Take 1 tablet by mouth  2 (two) times daily. Patient not taking: Reported on 11/21/2019 10/14/19   Jacquelin Hawking, PA-C  FLUoxetine (PROZAC) 20 MG capsule Take 1 capsule (20 mg total) by mouth daily. Patient not taking: Reported on 11/21/2019 10/08/19   Jacquelin Hawking, PA-C    Family History Family History  Adopted: Yes  Problem Relation Age of Onset  . Arthritis Sister   . Depression Sister   . ADD / ADHD Brother   . Drug abuse Mother   . Cholelithiasis Maternal Grandmother   . Bipolar disorder Maternal Grandmother   . Depression Maternal Grandmother   . Heart disease Maternal Grandmother     Social History Social History   Tobacco Use  . Smoking status: Current Every Day Smoker    Packs/day: 0.50    Years: 1.00    Pack years: 0.50    Last attempt to quit: 06/2019    Years since quitting: 0.4  . Smokeless tobacco: Never Used  Substance Use Topics  . Alcohol use: No    Comment: occ  . Drug use: No     Allergies   Strattera [atomoxetine hcl]   Review of Systems Review of Systems  Gastrointestinal: Positive for abdominal pain.  Genitourinary: Positive for pelvic pain and vaginal discharge.  All other systems reviewed and are negative.    Physical Exam Updated Vital Signs BP (!) 133/99   Pulse (!) 101   Temp 97.6 F (36.4 C)   Resp 18  Ht 5\' 5"  (1.651 m)   Wt 72.6 kg   LMP 10/05/2019   SpO2 100%   BMI 26.63 kg/m   Physical Exam Vitals signs and nursing note reviewed. Exam conducted with a chaperone present.  Constitutional:      General: She is not in acute distress.    Appearance: Normal appearance. She is well-developed.  HENT:     Head: Normocephalic and atraumatic.     Right Ear: Hearing normal.     Left Ear: Hearing normal.     Nose: Nose normal.  Eyes:     Conjunctiva/sclera: Conjunctivae normal.     Pupils: Pupils are equal, round, and reactive to light.  Neck:     Musculoskeletal: Normal range of motion and neck supple.  Cardiovascular:     Rate and  Rhythm: Regular rhythm.     Heart sounds: S1 normal and S2 normal. No murmur. No friction rub. No gallop.   Pulmonary:     Effort: Pulmonary effort is normal. No respiratory distress.     Breath sounds: Normal breath sounds.  Chest:     Chest wall: No tenderness.  Abdominal:     General: Bowel sounds are normal.     Palpations: Abdomen is soft.     Tenderness: There is abdominal tenderness in the suprapubic area. There is no guarding or rebound. Negative signs include Murphy's sign and McBurney's sign.     Hernia: No hernia is present.  Genitourinary:    General: Normal vulva.     Cervix: Cervical motion tenderness and friability present.     Uterus: Normal.      Adnexa: Right adnexa normal.       Left: Tenderness present. No mass or fullness.    Musculoskeletal: Normal range of motion.  Skin:    General: Skin is warm and dry.     Findings: No rash.  Neurological:     Mental Status: She is alert and oriented to person, place, and time.     GCS: GCS eye subscore is 4. GCS verbal subscore is 5. GCS motor subscore is 6.     Cranial Nerves: No cranial nerve deficit.     Sensory: No sensory deficit.     Coordination: Coordination normal.  Psychiatric:        Speech: Speech normal.        Behavior: Behavior normal.        Thought Content: Thought content normal.      ED Treatments / Results  Labs (all labs ordered are listed, but only abnormal results are displayed) Labs Reviewed  WET PREP, GENITAL - Abnormal; Notable for the following components:      Result Value   WBC, Wet Prep HPF POC MANY (*)    All other components within normal limits  COMPREHENSIVE METABOLIC PANEL - Abnormal; Notable for the following components:   Total Protein 8.8 (*)    All other components within normal limits  CBC - Abnormal; Notable for the following components:   WBC 12.0 (*)    All other components within normal limits  URINALYSIS, ROUTINE W REFLEX MICROSCOPIC - Abnormal; Notable for the  following components:   Hgb urine dipstick LARGE (*)    Protein, ur 30 (*)    Leukocytes,Ua SMALL (*)    All other components within normal limits  LIPASE, BLOOD  PREGNANCY, URINE  GC/CHLAMYDIA PROBE AMP ([place]) NOT AT [facility]    EKG None  Radiology No results found.  Procedures Procedures (  including critical care time)  Medications Ordered in ED Medications  cefTRIAXone (ROCEPHIN) injection 250 mg (has no administration in time range)  doxycycline (VIBRA-TABS) tablet 100 mg (has no administration in time range)     Initial Impression / Assessment and Plan / ED Course  I have reviewed the triage vital signs and the nursing notes.  Pertinent labs & imaging results that were available during my care of the patient were reviewed by me and considered in my medical decision making (see chart for details).        Patient presents to the emergency department for evaluation of 2 weeks of lower abdominal and pelvic discomfort with bloody discharge.  Patient without fever, GI symptoms.  Abdominal exam is benign -no tenderness in the right lower quadrant, no upper abdominal tenderness, negative Murphy's.  There is no guarding or rebound.  Patient does have some mild suprapubic tenderness and pelvic exam reveals tenderness in the area of the left adnexa without mass or fullness.  She does have cervical motion tenderness with mild bloody discharge present.  She is not pregnant.  Will treat empirically with antibiotics, follow-up with OB/GYN.  Final Clinical Impressions(s) / ED Diagnoses   Final diagnoses:  Abnormal uterine bleeding  Cervicitis    ED Discharge Orders    None       Gilda CreasePollina,  J, MD 11/22/19 [phone]

## 2019-11-21 NOTE — ED Notes (Signed)
Pt reports weird reddish discharge for the last 2 weeks  Has not followed with PCP,   Last unprotected sex- "a couple of weeks ago"  When asked regarding ? Pregnancy; pt reports she has not done a home preg test  Urine spec obtained and to the lab

## 2019-11-22 MED ORDER — DOXYCYCLINE HYCLATE 100 MG PO CAPS: 100.0000 mg | ORAL_CAPSULE | Freq: Two times a day (BID) | ORAL | 0 refills | Status: DC

## 2019-11-22 MED ORDER — CEFTRIAXONE SODIUM 250 MG IJ SOLR
250.0000 mg | Freq: Once | INTRAMUSCULAR | Status: AC
  Administered 2019-11-22: 250 mg via INTRAMUSCULAR
  Filled 2019-11-22: qty 250

## 2019-11-22 MED ORDER — DOXYCYCLINE HYCLATE 100 MG PO TABS
100.0000 mg | ORAL_TABLET | Freq: Once | ORAL | Status: AC
  Administered 2019-11-22: 100 mg via ORAL
  Filled 2019-11-22: qty 1

## 2019-11-22 MED ORDER — IBUPROFEN 800 MG PO TABS: 800.0000 mg | ORAL_TABLET | Freq: Four times a day (QID) | ORAL | 0 refills | Status: DC | PRN

## 2019-11-22 MED ORDER — LIDOCAINE HCL (PF) 1 % IJ SOLN
INTRAMUSCULAR | Status: AC
  Administered 2019-11-22: 01:00:00
  Filled 2019-11-22: qty 2

## 2019-11-24 LAB — GC/CHLAMYDIA PROBE AMP (~~LOC~~) NOT AT ARMC
Chlamydia: NEGATIVE
Neisseria Gonorrhea: NEGATIVE

## 2020-01-29 ENCOUNTER — Other Ambulatory Visit: Payer: Self-pay

## 2020-01-29 ENCOUNTER — Ambulatory Visit: Payer: Self-pay | Attending: Internal Medicine

## 2020-01-29 DIAGNOSIS — Z20822 Contact with and (suspected) exposure to covid-19: Secondary | ICD-10-CM | POA: Insufficient documentation

## 2020-01-30 LAB — NOVEL CORONAVIRUS, NAA: SARS-CoV-2, NAA: NOT DETECTED

## 2020-02-01 ENCOUNTER — Ambulatory Visit: Payer: Self-pay | Attending: Internal Medicine

## 2020-02-10 ENCOUNTER — Emergency Department (HOSPITAL_COMMUNITY): Payer: Self-pay

## 2020-02-10 ENCOUNTER — Other Ambulatory Visit: Payer: Self-pay

## 2020-02-10 ENCOUNTER — Emergency Department (HOSPITAL_COMMUNITY)
Admission: EM | Admit: 2020-02-10 | Discharge: 2020-02-10 | Disposition: A | Discharge status: Home / Self Care | Payer: Self-pay | Attending: Emergency Medicine | Admitting: Emergency Medicine

## 2020-02-10 ENCOUNTER — Encounter (HOSPITAL_COMMUNITY): Payer: Self-pay | Admitting: Emergency Medicine

## 2020-02-10 DIAGNOSIS — Y9339 Activity, other involving climbing, rappelling and jumping off: Secondary | ICD-10-CM | POA: Insufficient documentation

## 2020-02-10 DIAGNOSIS — Y929 Unspecified place or not applicable: Secondary | ICD-10-CM | POA: Insufficient documentation

## 2020-02-10 DIAGNOSIS — Z79899 Other long term (current) drug therapy: Secondary | ICD-10-CM | POA: Insufficient documentation

## 2020-02-10 DIAGNOSIS — J45909 Unspecified asthma, uncomplicated: Secondary | ICD-10-CM | POA: Insufficient documentation

## 2020-02-10 DIAGNOSIS — W172XXA Fall into hole, initial encounter: Secondary | ICD-10-CM | POA: Insufficient documentation

## 2020-02-10 DIAGNOSIS — F17210 Nicotine dependence, cigarettes, uncomplicated: Secondary | ICD-10-CM | POA: Insufficient documentation

## 2020-02-10 DIAGNOSIS — Y999 Unspecified external cause status: Secondary | ICD-10-CM | POA: Insufficient documentation

## 2020-02-10 DIAGNOSIS — Z23 Encounter for immunization: Secondary | ICD-10-CM | POA: Insufficient documentation

## 2020-02-10 DIAGNOSIS — S8392XA Sprain of unspecified site of left knee, initial encounter: Secondary | ICD-10-CM | POA: Insufficient documentation

## 2020-02-10 DIAGNOSIS — S80811A Abrasion, right lower leg, initial encounter: Secondary | ICD-10-CM | POA: Insufficient documentation

## 2020-02-10 MED ORDER — TETANUS-DIPHTH-ACELL PERTUSSIS 5-2.5-18.5 LF-MCG/0.5 IM SUSP
0.5000 mL | Freq: Once | INTRAMUSCULAR | Status: AC
  Administered 2020-02-10: 17:00:00 0.5 mL via INTRAMUSCULAR
  Filled 2020-02-10: qty 0.5
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
## 2020-02-10 NOTE — Discharge Instructions (Signed)
Use crutches and weight-bear as tolerated. Apply ice and elevate knee for 30 minutes at a time 3 times daily to help with pain and swelling. Take Motrin and Tylenol as needed as directed for pain.   Recheck with your doctor in 1 week.

## 2020-12-19 ENCOUNTER — Ambulatory Visit: Payer: Self-pay | Admitting: Physician Assistant

## 2021-03-23 ENCOUNTER — Other Ambulatory Visit: Payer: Self-pay

## 2021-03-23 ENCOUNTER — Encounter: Payer: Self-pay | Admitting: Nurse Practitioner

## 2021-03-23 ENCOUNTER — Ambulatory Visit: Payer: BC Managed Care – PPO | Admitting: Nurse Practitioner

## 2021-03-23 ENCOUNTER — Telehealth: Payer: Self-pay

## 2021-03-23 VITALS — BP 132/86 | HR 102 | Temp 98.8°F | Resp 20 | Ht 65.0 in | Wt 172.0 lb

## 2021-03-23 DIAGNOSIS — F909 Attention-deficit hyperactivity disorder, unspecified type: Secondary | ICD-10-CM

## 2021-03-23 DIAGNOSIS — J4530 Mild persistent asthma, uncomplicated: Secondary | ICD-10-CM

## 2021-03-23 DIAGNOSIS — Z124 Encounter for screening for malignant neoplasm of cervix: Secondary | ICD-10-CM | POA: Diagnosis not present

## 2021-03-23 DIAGNOSIS — Z7689 Persons encountering health services in other specified circumstances: Secondary | ICD-10-CM

## 2021-03-23 DIAGNOSIS — Z139 Encounter for screening, unspecified: Secondary | ICD-10-CM | POA: Diagnosis not present

## 2021-03-23 DIAGNOSIS — K219 Gastro-esophageal reflux disease without esophagitis: Secondary | ICD-10-CM

## 2021-03-23 DIAGNOSIS — F322 Major depressive disorder, single episode, severe without psychotic features: Secondary | ICD-10-CM

## 2021-03-23 DIAGNOSIS — F419 Anxiety disorder, unspecified: Secondary | ICD-10-CM

## 2021-03-23 MED ORDER — OMEPRAZOLE 40 MG PO CPDR: 40.0000 mg | DELAYED_RELEASE_CAPSULE | Freq: Every day | ORAL | 3 refills | Status: DC

## 2021-03-23 MED ORDER — SERTRALINE HCL 50 MG PO TABS: 50.0000 mg | ORAL_TABLET | Freq: Every day | ORAL | 3 refills | Status: DC

## 2021-03-23 NOTE — Assessment & Plan Note (Signed)
-  PHQ-9 = 21 today -Rx. Sertraline -had mother and grandmother die 2018 and 2020 -discussed grief share

## 2021-03-23 NOTE — Telephone Encounter (Signed)
Please call in omeprazole

## 2021-03-23 NOTE — Assessment & Plan Note (Addendum)
-  Rx. Omeprazole -will consider GDR in 2 months (end of May) -if no improvement will consider GI consult

## 2021-03-23 NOTE — Assessment & Plan Note (Signed)
-  will screen for HCV and HIV today

## 2021-03-23 NOTE — Assessment & Plan Note (Signed)
-  diagnosed as a child -no current meds, and she states she is doing well

## 2021-03-23 NOTE — Patient Instructions (Signed)
For depression, try attending [facility] meetings for group therapy. It is a cheap alternative to individual therapy session, and a quick google search can help you find locations and times.  Please have fasting labs drawn today.

## 2021-03-23 NOTE — Addendum Note (Signed)
Addended by: Bjorn Pippin on: 03/23/2021 08:45 AM   Modules accepted: Orders

## 2021-03-23 NOTE — Assessment & Plan Note (Signed)
-  uses PRN albuterol 

## 2021-03-23 NOTE — Telephone Encounter (Signed)
Rx resent.

## 2021-03-23 NOTE — Assessment & Plan Note (Addendum)
-  GAD-7 = 17 today -Rx. Sertraline for anx/dep -she may need psych referral in the future -discussed birth control like condoms while on SSRI -negative pregnancy test today

## 2021-03-23 NOTE — Progress Notes (Signed)
New Patient Office Visit  Subjective:  Patient ID: Alison Mcmillan, female    DOB: 1996-11-03  Age: 25 y.o. MRN: [id number]  CC:  Chief Complaint  Patient presents with  . New Patient (Initial Visit)    HPI Alison Mcmillan presents for new patient visit. Transferring care from the [facility] [place]. She has been followed by [facility] in the past for psychiatry per previous documentation, but she denies going there in the past. Last physical was over a year ago. No recent labs.  She states that she is having bad reflux and depression. She has been having reflux since age 61, but she states this is much worse in the last 6 months.  She has not taken any medicine for. She has been using tums at home, but that isn't helping. She states tat her reflux is so bad that she is vomiting frequently.  Depression has been going on since her mother died in 03/26/19 and her grandmother died.  She states that she hasn't taken medicine for this in the past, but there is old documentation of fluoxetine in her chart.  Past Medical History:  Diagnosis Date  . Abnormal uterine bleeding 06/01/2016  . ADD (attention deficit disorder)   . Anxiety   . Asthma   . Constipation   . Depression   . Gastroesophageal reflux     History reviewed. No pertinent surgical history.  Family History  Adopted: Yes  Problem Relation Age of Onset  . Arthritis Sister   . Depression Sister   . ADD / ADHD Brother   . Drug abuse Mother   . Cholelithiasis Maternal Grandmother   . Bipolar disorder Maternal Grandmother   . Depression Maternal Grandmother   . Heart disease Maternal Grandmother     Social History   Socioeconomic History  . Marital status: Single    Spouse name: Not on file  . Number of children: Not on file  . Years of education: Not on file  . Highest education level: Not on file  Occupational History  . Occupation: [employer]    Comment: Engineer, maintenance (IT)  Tobacco Use  . Smoking status:  Current Some Day Smoker    Packs/day: 0.25    Years: 2.00    Pack years: 0.50    Last attempt to quit: 06/2019    Years since quitting: 1.8  . Smokeless tobacco: Never Used  Vaping Use  . Vaping Use: Some days  . Substances: Nicotine, Flavoring  Substance and Sexual Activity  . Alcohol use: Yes    Comment: maybe 1-2 glasses per week  . Drug use: No  . Sexual activity: Yes    Birth control/protection: None  Other Topics Concern  . Not on file  Social History Narrative   9th grade-homeschooled   Social Determinants of Health   Financial Resource Strain: Not on file  Food Insecurity: Not on file  Transportation Needs: Not on file  Physical Activity: Not on file  Stress: Not on file  Social Connections: Not on file  Intimate Partner Violence: Not on file    ROS Review of Systems  Constitutional: Negative.   Respiratory: Negative.   Cardiovascular: Negative.   Gastrointestinal: Positive for abdominal pain and vomiting.       Reflux pain, and TUMS are no longer helping  Psychiatric/Behavioral: Positive for dysphoric mood. Negative for self-injury and suicidal ideas. The patient is nervous/anxious.     Objective:   Today's Vitals: BP 132/86  Pulse (!) 102   Temp 98.8 F (37.1 C)   Resp 20   Ht '5\' 5"'  (1.651 m)   Wt 172 lb (78 kg)   SpO2 96%   BMI 28.62 kg/m   Physical Exam Constitutional:      Appearance: Normal appearance.  Cardiovascular:     Rate and Rhythm: Normal rate and regular rhythm.     Pulses: Normal pulses.     Heart sounds: Normal heart sounds.  Pulmonary:     Effort: Pulmonary effort is normal.     Breath sounds: Normal breath sounds.  Neurological:     Mental Status: She is alert.  Psychiatric:        Behavior: Behavior normal.        Thought Content: Thought content normal.        Judgment: Judgment normal.     Comments: Depressed mood     Assessment & Plan:   Problem List Items Addressed This Visit      Respiratory   Asthma     -uses PRN albuterol        Digestive   Gastroesophageal reflux    -Rx. Omeprazole -will consider GDR in 2 months (end of May) -if no improvement will consider GI consult      Relevant Medications   omeprazole (PRILOSEC) 40 MG capsule     Other   Depression    -PHQ-9 = 21 today -Rx. Sertraline -had mother and grandmother die 2018 and 2020 -discussed grief share      Relevant Medications   sertraline (ZOLOFT) 50 MG tablet   ADHD (attention deficit hyperactivity disorder)    -diagnosed as a child -no current meds, and she states she is doing well       Encounter to establish care    -was seen by [facility] and there was documentation that she had been referred to [facility] previously; she denies going to [facility]      Relevant Orders   CMP14+EGFR   CBC with Differential/Platelet   Lipid Panel With LDL/HDL Ratio   Screening due    -will screen for HCV and HIV today      Relevant Orders   HCV Ab w/Rflx to Verification   HIV Antibody (routine testing w rflx)   Anxiety    -GAD-7 = 17 today -Rx. Sertraline for anx/dep -she may need psych referral in the future -discussed birth control like condoms while on SSRI -negative pregnancy test today      Relevant Medications   sertraline (ZOLOFT) 50 MG tablet      Outpatient Encounter Medications as of 03/23/2021  Medication Sig  . omeprazole (PRILOSEC) 40 MG capsule Take 1 capsule (40 mg total) by mouth daily.  . sertraline (ZOLOFT) 50 MG tablet Take 1 tablet (50 mg total) by mouth daily.  Marland Kitchen albuterol (VENTOLIN HFA) 108 (90 Base) MCG/ACT inhaler Inhale 2 puffs into the lungs every 4 (four) hours as needed. (Patient not taking: Reported on 03/23/2021)  . ibuprofen (ADVIL) 200 MG tablet Take 600-800 mg by mouth every 6 (six) hours as needed for mild pain or moderate pain. (Patient not taking: Reported on 03/23/2021)  . [DISCONTINUED] FLUoxetine (PROZAC) 20 MG capsule Take 1 capsule (20 mg total) by mouth daily. (Patient not  taking: Reported on 11/21/2019)   No facility-administered encounter medications on file as of 03/23/2021.    Follow-up: Return in about 1 month (around 04/23/2021) for Physical Exam (no pap).   Noreene Larsson, NP

## 2021-03-23 NOTE — Assessment & Plan Note (Addendum)
-  was seen by [facility] and there was documentation that she had been referred to [facility] previously; she denies going to [facility]

## 2021-03-24 LAB — CBC WITH DIFFERENTIAL/PLATELET
Basophils Absolute: 0 10*3/uL (ref 0.0–0.2)
Basos: 0 %
EOS (ABSOLUTE): 0 10*3/uL (ref 0.0–0.4)
Eos: 0 %
Hematocrit: 39.5 % (ref 34.0–46.6)
Hemoglobin: 13.1 g/dL (ref 11.1–15.9)
Immature Grans (Abs): 0 10*3/uL (ref 0.0–0.1)
Immature Granulocytes: 0 %
Lymphocytes Absolute: 2.8 10*3/uL (ref 0.7–3.1)
Lymphs: 31 %
MCH: 30.1 pg (ref 26.6–33.0)
MCHC: 33.2 g/dL (ref 31.5–35.7)
MCV: 91 fL (ref 79–97)
Monocytes Absolute: 0.7 10*3/uL (ref 0.1–0.9)
Monocytes: 8 %
Neutrophils Absolute: 5.4 10*3/uL (ref 1.4–7.0)
Neutrophils: 61 %
Platelets: 273 10*3/uL (ref 150–450)
RBC: 4.35 x10E6/uL (ref 3.77–5.28)
RDW: 12.2 % (ref 11.7–15.4)
WBC: 9 10*3/uL (ref 3.4–10.8)

## 2021-03-24 LAB — CMP14+EGFR
ALT: 16 IU/L (ref 0–32)
AST: 18 IU/L (ref 0–40)
Albumin/Globulin Ratio: 2 (ref 1.2–2.2)
Albumin: 4.8 g/dL (ref 3.9–5.0)
Alkaline Phosphatase: 73 IU/L (ref 44–121)
BUN/Creatinine Ratio: 16 (ref 9–23)
BUN: 12 mg/dL (ref 6–20)
Bilirubin Total: 0.3 mg/dL (ref 0.0–1.2)
CO2: 19 mmol/L — ABNORMAL LOW (ref 20–29)
Calcium: 9.2 mg/dL (ref 8.7–10.2)
Chloride: 102 mmol/L (ref 96–106)
Creatinine, Ser: 0.76 mg/dL (ref 0.57–1.00)
Globulin, Total: 2.4 g/dL (ref 1.5–4.5)
Glucose: 92 mg/dL (ref 65–99)
Potassium: 4.3 mmol/L (ref 3.5–5.2)
Sodium: 138 mmol/L (ref 134–144)
Total Protein: 7.2 g/dL (ref 6.0–8.5)
eGFR: 113 mL/min/{1.73_m2} (ref >59)

## 2021-03-24 LAB — HIV ANTIBODY (ROUTINE TESTING W REFLEX): HIV Screen 4th Generation wRfx: NONREACTIVE

## 2021-03-24 LAB — HCV AB W/RFLX TO VERIFICATION: HCV Ab: <0.1 s/co ratio (ref 0.0–0.9)

## 2021-03-24 LAB — LIPID PANEL WITH LDL/HDL RATIO
Cholesterol, Total: 139 mg/dL (ref 100–199)
HDL: 37 mg/dL — ABNORMAL LOW (ref >39)
LDL Chol Calc (NIH): 89 mg/dL (ref 0–99)
LDL/HDL Ratio: 2.4 ratio (ref 0.0–3.2)
Triglycerides: 60 mg/dL (ref 0–149)
VLDL Cholesterol Cal: 13 mg/dL (ref 5–40)

## 2021-03-24 LAB — HCV INTERPRETATION

## 2021-03-24 NOTE — Progress Notes (Signed)
Her labs are great.

## 2021-03-27 NOTE — Progress Notes (Signed)
I would give the medication at least another week. It can cause side effects like headaches, but they usually go away in the first week. If they aren't gone a week form today, we will change medications.

## 2021-04-27 ENCOUNTER — Encounter: Payer: BC Managed Care – PPO | Admitting: Nurse Practitioner

## 2021-05-24 ENCOUNTER — Emergency Department (HOSPITAL_COMMUNITY): Payer: BC Managed Care – PPO

## 2021-05-24 ENCOUNTER — Emergency Department (HOSPITAL_COMMUNITY)
Admission: EM | Admit: 2021-05-24 | Discharge: 2021-05-24 | Disposition: A | Discharge status: Home / Self Care | Payer: BC Managed Care – PPO | Attending: Emergency Medicine | Admitting: Emergency Medicine

## 2021-05-24 ENCOUNTER — Encounter (HOSPITAL_COMMUNITY): Payer: Self-pay

## 2021-05-24 ENCOUNTER — Other Ambulatory Visit: Payer: Self-pay

## 2021-05-24 DIAGNOSIS — R059 Cough, unspecified: Secondary | ICD-10-CM | POA: Insufficient documentation

## 2021-05-24 DIAGNOSIS — R110 Nausea: Secondary | ICD-10-CM | POA: Diagnosis not present

## 2021-05-24 DIAGNOSIS — J45909 Unspecified asthma, uncomplicated: Secondary | ICD-10-CM | POA: Diagnosis not present

## 2021-05-24 DIAGNOSIS — W19XXXA Unspecified fall, initial encounter: Secondary | ICD-10-CM | POA: Diagnosis not present

## 2021-05-24 DIAGNOSIS — K219 Gastro-esophageal reflux disease without esophagitis: Secondary | ICD-10-CM | POA: Insufficient documentation

## 2021-05-24 DIAGNOSIS — R1011 Right upper quadrant pain: Secondary | ICD-10-CM | POA: Insufficient documentation

## 2021-05-24 DIAGNOSIS — F17200 Nicotine dependence, unspecified, uncomplicated: Secondary | ICD-10-CM | POA: Insufficient documentation

## 2021-05-24 DIAGNOSIS — R1013 Epigastric pain: Secondary | ICD-10-CM | POA: Insufficient documentation

## 2021-05-24 DIAGNOSIS — N939 Abnormal uterine and vaginal bleeding, unspecified: Secondary | ICD-10-CM | POA: Insufficient documentation

## 2021-05-24 DIAGNOSIS — R1084 Generalized abdominal pain: Secondary | ICD-10-CM | POA: Diagnosis not present

## 2021-05-24 LAB — URINALYSIS, ROUTINE W REFLEX MICROSCOPIC
Bilirubin Urine: NEGATIVE
Glucose, UA: NEGATIVE mg/dL
Ketones, ur: 20 mg/dL — AB
Nitrite: NEGATIVE
Protein, ur: 30 mg/dL — AB
Specific Gravity, Urine: 1.025 (ref 1.005–1.030)
pH: 5 (ref 5.0–8.0)

## 2021-05-24 LAB — CBC WITH DIFFERENTIAL/PLATELET
Abs Immature Granulocytes: 0.05 10*3/uL (ref 0.00–0.07)
Basophils Absolute: 0 10*3/uL (ref 0.0–0.1)
Basophils Relative: 0 %
Eosinophils Absolute: 0 10*3/uL (ref 0.0–0.5)
Eosinophils Relative: 0 %
HCT: 40.9 % (ref 36.0–46.0)
Hemoglobin: 13.8 g/dL (ref 12.0–15.0)
Immature Granulocytes: 0 %
Lymphocytes Relative: 22 %
Lymphs Abs: 2.4 10*3/uL (ref 0.7–4.0)
MCH: 30.4 pg (ref 26.0–34.0)
MCHC: 33.7 g/dL (ref 30.0–36.0)
MCV: 90.1 fL (ref 80.0–100.0)
Monocytes Absolute: 0.8 10*3/uL (ref 0.1–1.0)
Monocytes Relative: 7 %
Neutro Abs: 7.9 10*3/uL — ABNORMAL HIGH (ref 1.7–7.7)
Neutrophils Relative %: 71 %
Platelets: 280 10*3/uL (ref 150–400)
RBC: 4.54 MIL/uL (ref 3.87–5.11)
RDW: 12.3 % (ref 11.5–15.5)
WBC: 11.2 10*3/uL — ABNORMAL HIGH (ref 4.0–10.5)
nRBC: 0 % (ref 0.0–0.2)

## 2021-05-24 LAB — COMPREHENSIVE METABOLIC PANEL
ALT: 17 U/L (ref 0–44)
AST: 21 U/L (ref 15–41)
Albumin: 4.8 g/dL (ref 3.5–5.0)
Alkaline Phosphatase: 66 U/L (ref 38–126)
Anion gap: 8 (ref 5–15)
BUN: 12 mg/dL (ref 6–20)
CO2: 23 mmol/L (ref 22–32)
Calcium: 9.1 mg/dL (ref 8.9–10.3)
Chloride: 105 mmol/L (ref 98–111)
Creatinine, Ser: 0.68 mg/dL (ref 0.44–1.00)
GFR, Estimated: >60 mL/min (ref >60)
Glucose, Bld: 86 mg/dL (ref 70–99)
Potassium: 4 mmol/L (ref 3.5–5.1)
Sodium: 136 mmol/L (ref 135–145)
Total Bilirubin: 0.7 mg/dL (ref 0.3–1.2)
Total Protein: 8.3 g/dL — ABNORMAL HIGH (ref 6.5–8.1)

## 2021-05-24 LAB — LIPASE, BLOOD: Lipase: 36 U/L (ref 11–51)

## 2021-05-24 LAB — PREGNANCY, URINE: Preg Test, Ur: NEGATIVE

## 2021-05-24 MED ORDER — IOHEXOL 9 MG/ML PO SOLN
ORAL | Status: AC
  Filled 2021-05-24: qty 1000

## 2021-05-24 MED ORDER — FAMOTIDINE IN NACL 20-0.9 MG/50ML-% IV SOLN
20.0000 mg | Freq: Once | INTRAVENOUS | Status: AC
  Administered 2021-05-24: 20 mg via INTRAVENOUS
  Filled 2021-05-24: qty 50

## 2021-05-24 MED ORDER — MEGESTROL ACETATE 20 MG PO TABS
20.0000 mg | ORAL_TABLET | Freq: Every day | ORAL | Status: DC
  Filled 2021-05-24 (×3): qty 1

## 2021-05-24 MED ORDER — MEGESTROL ACETATE 20 MG PO TABS: 20.0000 mg | ORAL_TABLET | Freq: Two times a day (BID) | ORAL | 0 refills | Status: DC

## 2021-05-24 NOTE — ED Triage Notes (Signed)
Pt reports upper abd pain that started while at work today.  Reports has been nauseated x 1 week.  Also reports started menstrual period last Wednesday and it has been heavier than usual.

## 2021-05-24 NOTE — Discharge Instructions (Signed)
Take medication as directed. Follow-up with your PCP and/or gynecology as discussed.

## 2021-05-24 NOTE — ED Provider Notes (Signed)
[facility] EMERGENCY DEPARTMENT Provider Note   CSN: [id number] Arrival date & time: 05/24/21  1330     History Chief Complaint  Patient presents with  . Abdominal Pain    Alison Mcmillan is a 25 y.o. female.  Patient with history of reflux, currently not treated. She reports having "reflux symptoms" x 1 week, along with nausea. Developed RUQ and epigastric pain today. No vomiting. Is currently on menstrual cycle, bleeding heavier than usual. Cramping low abdominal pain similar to normal menstrual cramps.  Symptoms are not worsened with food consumption.  The history is provided by the patient. No language interpreter was used.  Abdominal Pain Pain location:  Epigastric and RUQ Pain quality: aching and gnawing   Pain radiates to:  Does not radiate Pain severity:  Moderate Onset quality:  Sudden Duration:  1 day Timing:  Intermittent Progression:  Waxing and waning Chronicity:  New Ineffective treatments:  None tried Associated symptoms: cough, nausea and vaginal bleeding   Associated symptoms: no chest pain, no dysuria, no fever and no vomiting        Past Medical History:  Diagnosis Date  . Abnormal uterine bleeding 06/01/2016  . ADD (attention deficit disorder)   . Anxiety   . Asthma   . Constipation   . Depression   . Gastroesophageal reflux     Patient Active Problem List   Diagnosis Date Noted  . Encounter to establish care 03/23/2021  . Screening due 03/23/2021  . Anxiety 03/23/2021  . Irritable bowel syndrome with diarrhea 05/21/2016  . ADHD (attention deficit hyperactivity disorder) 01/20/2014  . Depression 11/24/2013  . Allergic rhinitis 11/18/2013  . Asthma 11/04/2013  . Family history of diabetes mellitus 01/20/2013  . Gastroesophageal reflux     History reviewed. No pertinent surgical history.   OB History    Gravida      Para      Term      Preterm      AB      Living  0     SAB      IAB      Ectopic      Multiple       Live Births              Family History  Adopted: Yes  Problem Relation Age of Onset  . Arthritis Sister   . Depression Sister   . ADD / ADHD Brother   . Drug abuse Mother   . Cholelithiasis Maternal Grandmother   . Bipolar disorder Maternal Grandmother   . Depression Maternal Grandmother   . Heart disease Maternal Grandmother     Social History   Tobacco Use  . Smoking status: Current Some Day Smoker    Packs/day: 0.25    Years: 2.00    Pack years: 0.50    Last attempt to quit: 06/2019    Years since quitting: 1.9  . Smokeless tobacco: Never Used  Vaping Use  . Vaping Use: Some days  . Substances: Nicotine, Flavoring  Substance Use Topics  . Alcohol use: Yes    Comment: maybe 1-2 glasses per week  . Drug use: No    Home Medications Prior to Admission medications   Medication Sig Start Date End Date Taking? Authorizing Provider  albuterol (VENTOLIN HFA) 108 (90 Base) MCG/ACT inhaler Inhale 2 puffs into the lungs every 4 (four) hours as needed. Patient not taking: Reported on 03/23/2021 10/08/19   Jacquelin Hawking, PA-C  ibuprofen (ADVIL) 200  MG tablet Take 600-800 mg by mouth every 6 (six) hours as needed for mild pain or moderate pain. Patient not taking: Reported on 03/23/2021    [provider]  omeprazole (PRILOSEC) 40 MG capsule Take 1 capsule (40 mg total) by mouth daily. 03/23/21   Heather Roberts, NP  sertraline (ZOLOFT) 50 MG tablet Take 1 tablet (50 mg total) by mouth daily. 03/23/21   Heather Roberts, NP  FLUoxetine (PROZAC) 20 MG capsule Take 1 capsule (20 mg total) by mouth daily. Patient not taking: Reported on 11/21/2019 10/08/19 11/22/19  Jacquelin Hawking, PA-C    Allergies    Strattera [atomoxetine hcl]  Review of Systems   Review of Systems  Constitutional: Negative for fever.  Respiratory: Positive for cough.   Cardiovascular: Negative for chest pain.  Gastrointestinal: Positive for abdominal pain and nausea. Negative for vomiting.   Genitourinary: Positive for vaginal bleeding. Negative for dysuria and frequency.  All other systems reviewed and are negative.   Physical Exam Updated Vital Signs BP (!) 119/94 (BP Location: Right Arm)   Pulse 88   Temp 98.7 F (37.1 C) (Oral)   Resp 20   Ht 5\' 5"  (1.651 m)   Wt 79.8 kg   LMP 05/17/2021   SpO2 100%   BMI 29.29 kg/m   Physical Exam Vitals and nursing note reviewed.  Constitutional:      Appearance: She is well-developed.  HENT:     Head: Normocephalic.     Nose: Nose normal.     Mouth/Throat:     Mouth: Mucous membranes are moist.  Eyes:     General: No scleral icterus.    Conjunctiva/sclera: Conjunctivae normal.  Cardiovascular:     Rate and Rhythm: Normal rate.  Pulmonary:     Effort: Pulmonary effort is normal.     Breath sounds: Normal breath sounds.  Abdominal:     General: Abdomen is flat. Bowel sounds are normal. There is no distension.     Palpations: Abdomen is soft.     Tenderness: There is abdominal tenderness in the right upper quadrant and epigastric area.  Genitourinary:    Vagina: Bleeding present.  Skin:    General: Skin is warm and dry.  Neurological:     Mental Status: She is alert and oriented to person, place, and time.  Psychiatric:        Mood and Affect: Mood normal.        Behavior: Behavior normal.     ED Results / Procedures / Treatments   Labs (all labs ordered are listed, but only abnormal results are displayed) Labs Reviewed  CBC WITH DIFFERENTIAL/PLATELET - Abnormal; Notable for the following components:      Result Value   WBC 11.2 (*)    Neutro Abs 7.9 (*)    All other components within normal limits  COMPREHENSIVE METABOLIC PANEL - Abnormal; Notable for the following components:   Total Protein 8.3 (*)    All other components within normal limits  URINALYSIS, ROUTINE W REFLEX MICROSCOPIC - Abnormal; Notable for the following components:   APPearance HAZY (*)    Hgb urine dipstick SMALL (*)     Ketones, ur 20 (*)    Protein, ur 30 (*)    Leukocytes,Ua SMALL (*)    Bacteria, UA RARE (*)    All other components within normal limits  PREGNANCY, URINE  LIPASE, BLOOD    EKG None  Radiology CT ABDOMEN PELVIS WO CONTRAST  Result Date: 05/24/2021  CLINICAL DATA:  Acute epigastric abdominal pain. EXAM: CT ABDOMEN AND PELVIS WITHOUT CONTRAST TECHNIQUE: Multidetector CT imaging of the abdomen and pelvis was performed following the standard protocol without IV contrast. COMPARISON:  None. FINDINGS: Lower chest: No acute abnormality. Hepatobiliary: No focal liver abnormality is seen. No gallstones, gallbladder wall thickening, or biliary dilatation. Pancreas: Unremarkable. No pancreatic ductal dilatation or surrounding inflammatory changes. Spleen: Normal in size without focal abnormality. Adrenals/Urinary Tract: Adrenal glands are unremarkable. Kidneys are normal, without renal calculi, focal lesion, or hydronephrosis. Bladder is unremarkable. Stomach/Bowel: Stomach is within normal limits. Appendix appears normal. No evidence of bowel wall thickening, distention, or inflammatory changes. Vascular/Lymphatic: No significant vascular findings are present. No enlarged abdominal or pelvic lymph nodes. Reproductive: Uterus and bilateral adnexa are unremarkable. Other: No abdominal wall hernia or abnormality. No abdominopelvic ascites. Musculoskeletal: No acute or significant osseous findings. IMPRESSION: No abnormality seen in the abdomen or pelvis. Electronically Signed   By: Lupita Raider M.D.   On: 05/24/2021 21:44    Procedures Procedures   Medications Ordered in ED Medications  megestrol (MEGACE) tablet 20 mg (20 mg Oral Not Given 05/24/21 2231)  famotidine (PEPCID) IVPB 20 mg premix (0 mg Intravenous Stopped 05/24/21 1949)  iohexol (OMNIPAQUE) 9 MG/ML oral solution (  Contrast Given 05/24/21 1948)    ED Course  I have reviewed the triage vital signs and the nursing notes.  Pertinent labs  & imaging results that were available during my care of the patient were reviewed by me and considered in my medical decision making (see chart for details).    MDM Rules/Calculators/A&P                          Patient with abnormal uterine bleeding onset one week ago. Menses heavier than normal. No anemia. Patient started on megace. Recommend follow-up with gynecology.  Patient is nontoxic, nonseptic appearing, in no apparent distress.  Pain improved with treatment in the ED. Labs, imaging and vitals reviewed.  Patient does not meet the SIRS or Sepsis criteria.  On repeat exam patient does not have a surgical abdomen and there are no peritoneal signs.  No indication of appendicitis, bowel obstruction, bowel perforation, cholecystitis, diverticulitis, pregnancy.  Patient discharged home with symptomatic treatment and given strict instructions for follow-up with their primary care physician.  I have also discussed reasons to return immediately to the ER.  Patient expresses understanding and agrees with plan.     Final Clinical Impression(s) / ED Diagnoses Final diagnoses:  Right upper quadrant abdominal pain  Nausea  Abnormal uterine bleeding (AUB)    Rx / DC Orders ED Discharge Orders         Ordered    megestrol (MEGACE) 20 MG tablet  2 times daily        05/24/21 2219           Felicie Morn, NP 05/24/21 4431    Linwood Dibbles, MD 05/27/21 1323

## 2021-05-24 NOTE — ED Notes (Signed)
Patient transported to CT 

## 2021-05-31 ENCOUNTER — Other Ambulatory Visit: Payer: BC Managed Care – PPO | Admitting: Adult Health

## 2021-06-02 ENCOUNTER — Other Ambulatory Visit: Payer: Self-pay

## 2021-06-02 ENCOUNTER — Other Ambulatory Visit (HOSPITAL_COMMUNITY)
Admission: RE | Admit: 2021-06-02 | Discharge: 2021-06-02 | Disposition: A | Discharge status: Home / Self Care | Payer: BC Managed Care – PPO | Source: Ambulatory Visit | Attending: Adult Health | Admitting: Adult Health

## 2021-06-02 ENCOUNTER — Ambulatory Visit (INDEPENDENT_AMBULATORY_CARE_PROVIDER_SITE_OTHER): Payer: BC Managed Care – PPO | Admitting: Adult Health

## 2021-06-02 ENCOUNTER — Encounter: Payer: Self-pay | Admitting: Adult Health

## 2021-06-02 VITALS — BP 113/75 | HR 73 | Ht 65.0 in | Wt 177.0 lb

## 2021-06-02 DIAGNOSIS — N921 Excessive and frequent menstruation with irregular cycle: Secondary | ICD-10-CM | POA: Diagnosis not present

## 2021-06-02 DIAGNOSIS — F419 Anxiety disorder, unspecified: Secondary | ICD-10-CM

## 2021-06-02 DIAGNOSIS — N946 Dysmenorrhea, unspecified: Secondary | ICD-10-CM

## 2021-06-02 DIAGNOSIS — Z01419 Encounter for gynecological examination (general) (routine) without abnormal findings: Secondary | ICD-10-CM

## 2021-06-02 DIAGNOSIS — F32A Depression, unspecified: Secondary | ICD-10-CM

## 2021-06-02 DIAGNOSIS — Z30013 Encounter for initial prescription of injectable contraceptive: Secondary | ICD-10-CM

## 2021-06-02 LAB — POCT URINE PREGNANCY: Preg Test, Ur: NEGATIVE

## 2021-06-02 MED ORDER — MEDROXYPROGESTERONE ACETATE 150 MG/ML IM SUSP: 150.0000 mg | INTRAMUSCULAR | 4 refills | Status: DC

## 2021-06-02 MED ORDER — MEDROXYPROGESTERONE ACETATE 150 MG/ML IM SUSY: PREFILLED_SYRINGE | Freq: Once | INTRAMUSCULAR | Status: AC

## 2021-06-02 NOTE — Progress Notes (Signed)
Patient ID: Alison Mcmillan, female   DOB: 05-11-1996, 25 y.o.   MRN: [id number] History of Present Illness: Alison Mcmillan is a 25 year old white female,single, G0P0 in for a well woman gyn exam and first pap and was seen in ER 05/24/21 and given megace for bleeding. Her last 2-3 periods heavier, changes pads or tampon every 30-60 minutes. HGB was 13.8 05/24/21. She had normal CT in ER 05/24/21 of abdomen and pelvis. She works at [employer].  PCP is Wynn Banker NP.   Current Medications, Allergies, Past Medical History, Past Surgical History, Family History and Social History were reviewed in [employer] record.     Review of Systems: Patient denies any headaches, hearing loss, fatigue, blurred vision, shortness of breath, chest pain, abdominal pain, problems with bowel movements, urination, or intercourse. No joint pain or mood swings. +heavy periods  +pain with periods +reflux, was prescribed Prilosec in ER      Physical Exam:BP 113/75 (BP Location: Right Arm, Patient Position: Sitting, Cuff Size: Normal)   Pulse 73   Ht 5\' 5"  (1.651 m)   Wt 177 lb (80.3 kg)   LMP 05/17/2021   BMI 29.45 kg/m UPT is negative. General:  Well developed, well nourished, no acute distress Skin:  Warm and dry Neck:  Midline trachea, normal thyroid, good ROM, no lymphadenopathy Lungs; Clear to auscultation bilaterally Breast:  No dominant palpable mass, retraction, or nipple discharge Cardiovascular: Regular rate and rhythm Abdomen:  Soft, non tender, no hepatosplenomegaly Pelvic:  External genitalia is normal in appearance, no lesions.  The vagina is normal in appearance. Urethra has no lesions or masses. The cervix is nulliparous, pap with GC/CHL performed.  Uterus is felt to be normal size, shape, and contour,mildly tender.  No adnexal masses or tenderness noted.Bladder is non tender, no masses felt. Extremities/musculoskeletal:  No swelling or varicosities noted, no clubbing or  cyanosis Psych:  No mood changes, alert and cooperative,seems happy AA is 2 Fall risk is low Depression screen [facility] 2/9 06/02/2021 03/23/2021  Decreased Interest 1 3  Down, Depressed, Hopeless 1 3  PHQ - 2 Score 2 6  Altered sleeping 1 3  Tired, decreased energy 3 3  Change in appetite 1 3  Feeling bad or failure about yourself  3 3  Trouble concentrating 1 3  Moving slowly or fidgety/restless 0 0  Suicidal thoughts 0 0  PHQ-9 Score 11 21  Difficult doing work/chores - Somewhat difficult  she denies any SI or HI .  GAD 7 : Generalized Anxiety Score 06/02/2021 03/23/2021  Nervous, Anxious, on Edge 3 3  Control/stop worrying 3 3  Worry too much - different things 2 3  Trouble relaxing 3 3  Restless 2 3  Easily annoyed or irritable 1 1  Afraid - awful might happen 1 1  Total GAD 7 Score 15 17   She was given zoloft 03/23/21 but stopped it says anxiety worse, she will call PCP back about this  Upstream - 06/02/21 1020      Pregnancy Intention Screening   Does the patient want to become pregnant in the next year? No    Does the patient's partner want to become pregnant in the next year? No    Would the patient like to discuss contraceptive options today? Yes      Contraception Wrap Up   Current Method Female Condom    End Method Female Condom;Hormonal Injection    Contraception Counseling Provided Yes  Examination chaperoned by Lawanna Kobus RN  Impression and Plan: 1. Encounter for gynecological examination with Papanicolaou smear of cervix Pap sent Physical in 1 year Pap in 3 years if normal Decrease smoking and try to stop   2. Menorrhagia with irregular cycle Discussed options to control periods and birth control and she wants depo, will give first injection in office now  3. Dysmenorrhea   4. Encounter for initial prescription of injectable contraceptive Will rx depo Meds ordered this encounter  Medications  . medroxyPROGESTERone (DEPO-PROVERA) 150 MG/ML injection     Sig: Inject 1 mL (150 mg total) into the muscle every 3 (three) months.    Dispense:  1 mL    Refill:  4    Order Specific Question:   Supervising Provider    Answer:   Despina Hidden, LUTHER H [[id number]]  . medroxyPROGESTERone Acetate SUSY    5. Anxiety and depression Call PCP for Follow up

## 2021-06-05 LAB — CYTOLOGY - PAP
Chlamydia: NEGATIVE
Comment: NEGATIVE
Comment: NORMAL
Diagnosis: NEGATIVE
Neisseria Gonorrhea: NEGATIVE

## 2021-06-06 ENCOUNTER — Telehealth: Payer: Self-pay | Admitting: Adult Health

## 2021-06-06 ENCOUNTER — Encounter: Payer: Self-pay | Admitting: Adult Health

## 2021-06-06 DIAGNOSIS — A599 Trichomoniasis, unspecified: Secondary | ICD-10-CM

## 2021-06-06 HISTORY — DX: Trichomoniasis, unspecified: A59.9

## 2021-06-06 MED ORDER — METRONIDAZOLE 500 MG PO TABS: 500.0000 mg | ORAL_TABLET | Freq: Two times a day (BID) | ORAL | 0 refills | Status: DC

## 2021-06-06 NOTE — Telephone Encounter (Signed)
Pt aware that +trich, will rx flagyl, need to treat partner,no sex POC in 2 weeks... will let me know about partner

## 2021-06-20 ENCOUNTER — Other Ambulatory Visit: Payer: Self-pay

## 2021-06-20 ENCOUNTER — Ambulatory Visit: Payer: BC Managed Care – PPO | Admitting: Adult Health

## 2021-06-25 ENCOUNTER — Other Ambulatory Visit: Payer: Self-pay

## 2021-06-25 ENCOUNTER — Emergency Department (HOSPITAL_COMMUNITY)
Admission: EM | Admit: 2021-06-25 | Discharge: 2021-06-25 | Disposition: A | Discharge status: Home / Self Care | Payer: BC Managed Care – PPO | Attending: Emergency Medicine | Admitting: Emergency Medicine

## 2021-06-25 ENCOUNTER — Emergency Department (HOSPITAL_COMMUNITY): Payer: BC Managed Care – PPO

## 2021-06-25 ENCOUNTER — Encounter (HOSPITAL_COMMUNITY): Payer: Self-pay | Admitting: Emergency Medicine

## 2021-06-25 DIAGNOSIS — F17210 Nicotine dependence, cigarettes, uncomplicated: Secondary | ICD-10-CM | POA: Insufficient documentation

## 2021-06-25 DIAGNOSIS — R059 Cough, unspecified: Secondary | ICD-10-CM | POA: Diagnosis not present

## 2021-06-25 DIAGNOSIS — U071 COVID-19: Secondary | ICD-10-CM | POA: Insufficient documentation

## 2021-06-25 DIAGNOSIS — J45909 Unspecified asthma, uncomplicated: Secondary | ICD-10-CM | POA: Diagnosis not present

## 2021-06-25 DIAGNOSIS — R519 Headache, unspecified: Secondary | ICD-10-CM | POA: Diagnosis not present

## 2021-06-25 LAB — I-STAT CHEM 8, ED
BUN: 14 mg/dL (ref 6–20)
Calcium, Ion: 1.18 mmol/L (ref 1.15–1.40)
Chloride: 108 mmol/L (ref 98–111)
Creatinine, Ser: 1 mg/dL (ref 0.44–1.00)
Glucose, Bld: 97 mg/dL (ref 70–99)
HCT: 38 % (ref 36.0–46.0)
Hemoglobin: 12.9 g/dL (ref 12.0–15.0)
Potassium: 3.4 mmol/L — ABNORMAL LOW (ref 3.5–5.1)
Sodium: 142 mmol/L (ref 135–145)
TCO2: 22 mmol/L (ref 22–32)

## 2021-06-25 LAB — RESP PANEL BY RT-PCR (FLU A&B, COVID) ARPGX2
Influenza A by PCR: NEGATIVE
Influenza B by PCR: NEGATIVE
SARS Coronavirus 2 by RT PCR: POSITIVE — AB

## 2021-06-25 MED ORDER — NIRMATRELVIR/RITONAVIR (PAXLOVID)TABLET: 3.0000 | ORAL_TABLET | Freq: Two times a day (BID) | ORAL | 0 refills | Status: AC

## 2021-06-25 NOTE — ED Provider Notes (Signed)
[facility] EMERGENCY DEPARTMENT Provider Note   CSN: [id number] Arrival date & time: 06/25/21  1618     History Chief Complaint  Patient presents with   Generalized Body Aches    Alison Mcmillan is a 25 y.o. female.  HPI  Patient with significant medical history of asthma, constipation, diarrhea, presents with chief complaint of URI-like symptoms.  Patient states she developed headaches, congestion, productive cough, lack of appetite, nausea without vomiting, diarrhea, general body aches.  States this happened around 3 PM yesterday, and has gotten worse, she denies recent sick contacts, is up-to-date on her COVID and her influenza vaccines, she is not immunocompromise.  She denies chest pain, shortness of breath, tolerating p.o. at this time.  She denies any alleviating factors.  Past Medical History:  Diagnosis Date   Abnormal uterine bleeding 06/01/2016   ADD (attention deficit disorder)    Anxiety    Asthma    Constipation    Depression    Gastroesophageal reflux    Trichimoniasis 06/06/2021   06/06/21 rx flagyl POC    Patient Active Problem List   Diagnosis Date Noted   Trichimoniasis 06/06/2021   Dysmenorrhea 06/02/2021   Menorrhagia with irregular cycle 06/02/2021   Encounter for gynecological examination with Papanicolaou smear of cervix 06/02/2021   Encounter for initial prescription of injectable contraceptive 06/02/2021   Anxiety and depression 06/02/2021   Encounter to establish care 03/23/2021   Screening due 03/23/2021   Anxiety 03/23/2021   Irritable bowel syndrome with diarrhea 05/21/2016   ADHD (attention deficit hyperactivity disorder) 01/20/2014   Depression 11/24/2013   Allergic rhinitis 11/18/2013   Asthma 11/04/2013   Family history of diabetes mellitus 01/20/2013   Gastroesophageal reflux     History reviewed. No pertinent surgical history.   OB History     Gravida  0   Para  0   Term  0   Preterm  0   AB  0   Living  0       SAB  0   IAB  0   Ectopic  0   Multiple  0   Live Births  0           Family History  Adopted: Yes  Problem Relation Age of Onset   Arthritis Sister    Depression Sister    ADD / ADHD Brother    Drug abuse Mother    Cholelithiasis Maternal Grandmother    Bipolar disorder Maternal Grandmother    Depression Maternal Grandmother    Heart disease Maternal Grandmother     Social History   Tobacco Use   Smoking status: Some Days    Packs/day: 0.25    Years: 2.00    Pack years: 0.50    Types: Cigarettes   Smokeless tobacco: Never  Vaping Use   Vaping Use: Some days   Substances: Nicotine, Flavoring  Substance Use Topics   Alcohol use: Yes    Comment: maybe 1-2 glasses per week   Drug use: No    Home Medications Prior to Admission medications   Medication Sig Start Date End Date Taking? Authorizing Provider  nirmatrelvir/ritonavir EUA (PAXLOVID) TABS Take 3 tablets by mouth 2 (two) times daily for 5 days. Take nirmatrelvir (150 mg) two tablets twice daily for 5 days and ritonavir (100 mg) one tablet twice daily for 5 days. 06/25/21 06/30/21 Yes Carroll Sage, PA-C  ibuprofen (ADVIL) 200 MG tablet Take 600-800 mg by mouth every 6 (six) hours as  needed for mild pain or moderate pain.    [provider]  medroxyPROGESTERone (DEPO-PROVERA) 150 MG/ML injection Inject 1 mL (150 mg total) into the muscle every 3 (three) months. 06/02/21   Adline Potter, NP  megestrol (MEGACE) 20 MG tablet Take 1 tablet (20 mg total) by mouth 2 (two) times daily. 05/24/21   Felicie Morn, NP  metroNIDAZOLE (FLAGYL) 500 MG tablet Take 1 tablet (500 mg total) by mouth 2 (two) times daily. 06/06/21   Adline Potter, NP  omeprazole (PRILOSEC) 40 MG capsule Take 1 capsule (40 mg total) by mouth daily. 03/23/21   Heather Roberts, NP  FLUoxetine (PROZAC) 20 MG capsule Take 1 capsule (20 mg total) by mouth daily. Patient not taking: Reported on 11/21/2019 10/08/19 11/22/19  Jacquelin Hawking, PA-C    Allergies    Strattera [atomoxetine hcl]  Review of Systems   Review of Systems  Constitutional:  Positive for chills and fever.  HENT:  Positive for congestion. Negative for sore throat.   Respiratory:  Positive for cough. Negative for shortness of breath.   Cardiovascular:  Negative for chest pain.  Gastrointestinal:  Positive for diarrhea and nausea. Negative for abdominal pain and vomiting.  Genitourinary:  Negative for enuresis.  Musculoskeletal:  Negative for back pain.  Skin:  Negative for rash.  Neurological:  Positive for headaches. Negative for dizziness.  Hematological:  Does not bruise/bleed easily.   Physical Exam Updated Vital Signs BP 110/87   Pulse 89   Temp 98.7 F (37.1 C) (Oral)   Resp 18   Ht 5\' 5"  (1.651 m)   Wt 79.8 kg   LMP 05/24/2021   SpO2 99%   BMI 29.29 kg/m   Physical Exam Vitals and nursing note reviewed.  Constitutional:      General: She is not in acute distress.    Appearance: She is not ill-appearing.  HENT:     Head: Normocephalic and atraumatic.     Right Ear: External ear normal.     Left Ear: External ear normal.     Nose: Congestion present.     Comments: Erythematous turbinates    Mouth/Throat:     Mouth: Mucous membranes are moist.     Pharynx: Oropharynx is clear. No oropharyngeal exudate or posterior oropharyngeal erythema.  Eyes:     Conjunctiva/sclera: Conjunctivae normal.  Cardiovascular:     Rate and Rhythm: Normal rate and regular rhythm.     Pulses: Normal pulses.     Heart sounds: No murmur heard.   No friction rub. No gallop.  Pulmonary:     Effort: No respiratory distress.     Breath sounds: No wheezing, rhonchi or rales.  Abdominal:     Palpations: Abdomen is soft.     Tenderness: There is no abdominal tenderness.  Musculoskeletal:     Right lower leg: No edema.     Left lower leg: No edema.  Skin:    General: Skin is warm and dry.  Neurological:     Mental Status: She is alert.   Psychiatric:        Mood and Affect: Mood normal.    ED Results / Procedures / Treatments   Labs (all labs ordered are listed, but only abnormal results are displayed) Labs Reviewed  RESP PANEL BY RT-PCR (FLU A&B, COVID) ARPGX2 - Abnormal; Notable for the following components:      Result Value   SARS Coronavirus 2 by RT PCR POSITIVE (*)  All other components within normal limits  I-STAT CHEM 8, ED - Abnormal; Notable for the following components:   Potassium 3.4 (*)    All other components within normal limits    EKG None  Radiology DG Chest Port 1 View  Result Date: 06/25/2021 CLINICAL DATA:  Cough. EXAM: PORTABLE CHEST 1 VIEW COMPARISON:  None. FINDINGS: The heart size and mediastinal contours are within normal limits. Both lungs are clear. The visualized skeletal structures are unremarkable. IMPRESSION: No active disease. Electronically Signed   By: Gerome Sam III M.D   On: 06/25/2021 18:04    Procedures Procedures   Medications Ordered in ED Medications - No data to display  ED Course  I have reviewed the triage vital signs and the nursing notes.  Pertinent labs & imaging results that were available during my care of the patient were reviewed by me and considered in my medical decision making (see chart for details).    MDM Rules/Calculators/A&P                         Initial impression-patient presents with URI-like symptoms.  She is alert, does not appear acute stress, vital signs reassuring.  Will obtain i-STAT Chem-8 to assess kidney function, respiratory panel, chest x-ray and reassess.  Work-up-Chem-8 shows hypokalemia 3.4, chest x-ray negative for acute findings, respiratory panel positive for COVID.  Reassessment-updated patient on lab work and imaging, will start patient on antivirals for COVID, patient is agreeable to this plan and discharge.  Rule out- Low suspicion for systemic infection as patient is nontoxic-appearing, vital signs reassuring,  no obvious source infection noted on exam.  Low suspicion for pneumonia as lung sounds are clear bilaterally, x-ray did not reveal any acute findings.  I have low suspicion for PE as patient denies pleuritic chest pain, shortness of breath, patient is PERC. low suspicion for strep throat as oropharynx was visualized, no erythema or exudates noted.  Low suspicion patient would need  hospitalized due to viral infection or Covid as vital signs reassuring, patient is not in respiratory distress.    Plan-  URI-like symptoms-suspect secondary due to COVID, will start her on antivirals, follow-up with post-COVID care for further evaluation.  Vital signs have remained stable, no indication for hospital admission.    Patient given at home care as well strict return precautions.  Patient verbalized that they understood agreed to said plan.  Final Clinical Impression(s) / ED Diagnoses Final diagnoses:  COVID    Rx / DC Orders ED Discharge Orders          Ordered    nirmatrelvir/ritonavir EUA (PAXLOVID) TABS  2 times daily        06/25/21 1833             Carroll Sage, PA-C 06/25/21 1836    Benjiman Core, MD 06/26/21 1553

## 2021-06-25 NOTE — ED Triage Notes (Signed)
Patient c/o generalized body aches with productive cough. Per patient thick yellow sputum. Patient unsure of fever but reports sweating and chills. Per patient started last night and getting progressively worse. Patient taking Muniex and tylenol with no relief.

## 2021-06-25 NOTE — Discharge Instructions (Addendum)
You have been seen here for URI like symptoms.  I recommend taking Tylenol for fever control and ibuprofen for pain control please follow dosing on the back of bottle.  I recommend staying hydrated and if you do not an appetite, I recommend soups as this will provide you with fluids and calories.    you are Covid positive you must self quarantine for 5 days starting on symptom onset, if at the end of those 5 days you are feeling better you may return back to school/work, if you continue to have symptoms you must self quarantine for additional 5 days.  I would like you to contact "post Covid care" as they will provide you with information how to manage your Covid symptoms Come back to the emergency department if you develop chest pain, shortness of breath, severe abdominal pain, uncontrolled nausea, vomiting, diarrhea.  

## 2021-07-06 ENCOUNTER — Other Ambulatory Visit: Payer: Self-pay

## 2021-07-06 ENCOUNTER — Ambulatory Visit: Payer: BC Managed Care – PPO | Admitting: Adult Health

## 2021-07-06 ENCOUNTER — Encounter: Payer: Self-pay | Admitting: Adult Health

## 2021-07-06 VITALS — BP 127/82 | HR 78 | Ht 65.0 in | Wt 164.0 lb

## 2021-07-06 DIAGNOSIS — A599 Trichomoniasis, unspecified: Secondary | ICD-10-CM

## 2021-07-06 DIAGNOSIS — R102 Pelvic and perineal pain: Secondary | ICD-10-CM | POA: Insufficient documentation

## 2021-07-06 DIAGNOSIS — F32A Depression, unspecified: Secondary | ICD-10-CM | POA: Diagnosis not present

## 2021-07-06 LAB — POCT WET PREP (WET MOUNT): Trichomonas Wet Prep HPF POC: ABSENT
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
## 2021-08-25 ENCOUNTER — Other Ambulatory Visit: Payer: Self-pay

## 2021-08-25 ENCOUNTER — Ambulatory Visit: Payer: BC Managed Care – PPO | Admitting: Nurse Practitioner

## 2021-08-25 ENCOUNTER — Ambulatory Visit: Payer: BC Managed Care – PPO

## 2021-08-25 ENCOUNTER — Encounter: Payer: Self-pay | Admitting: Nurse Practitioner

## 2021-08-25 VITALS — BP 115/75 | HR 82 | Ht 65.0 in | Wt 177.0 lb

## 2021-08-25 DIAGNOSIS — K219 Gastro-esophageal reflux disease without esophagitis: Secondary | ICD-10-CM

## 2021-08-25 DIAGNOSIS — H6123 Impacted cerumen, bilateral: Secondary | ICD-10-CM | POA: Diagnosis not present

## 2021-08-25 DIAGNOSIS — J029 Acute pharyngitis, unspecified: Secondary | ICD-10-CM | POA: Insufficient documentation

## 2021-08-25 LAB — POCT RAPID STREP A (OFFICE): Rapid Strep A Screen: NEGATIVE

## 2021-08-25 MED ORDER — FAMOTIDINE 20 MG PO TABS: 20.0000 mg | ORAL_TABLET | Freq: Two times a day (BID) | ORAL | 1 refills | Status: DC

## 2021-08-25 MED ORDER — DEBROX 6.5 % OT SOLN: 5.0000 [drp] | Freq: Two times a day (BID) | OTIC | 0 refills | Status: DC

## 2021-08-25 NOTE — Progress Notes (Signed)
Acute Office Visit  Subjective:    Patient ID: Alison Mcmillan, female    DOB: 06-19-1996, 25 y.o.   MRN: [id number]  Chief Complaint  Patient presents with   Gastroesophageal Reflux    Chronic, has been taking omeprazole twice daily otc and feels like that has made it worse, over the past month.    HPI Patient is in today for indigestions. She has been taking OTC omeprazole, but she is still having GERD.  She takes ibuprofen 600 mg 2-3 x per day  Past Medical History:  Diagnosis Date   Abnormal uterine bleeding 06/01/2016   ADD (attention deficit disorder)    Anxiety    Asthma    Constipation    Depression    Gastroesophageal reflux    Trichimoniasis 06/06/2021   06/06/21 rx flagyl POC    History reviewed. No pertinent surgical history.  Family History  Adopted: Yes  Problem Relation Age of Onset   Arthritis Sister    Depression Sister    ADD / ADHD Brother    Drug abuse Mother    Cholelithiasis Maternal Grandmother    Bipolar disorder Maternal Grandmother    Depression Maternal Grandmother    Heart disease Maternal Grandmother     Social History   Socioeconomic History   Marital status: Single    Spouse name: Not on file   Number of children: Not on file   Years of education: Not on file   Highest education level: Not on file  Occupational History   Occupation: [employer]    Comment: Engineer, maintenance (IT)  Tobacco Use   Smoking status: Some Days    Packs/day: 0.25    Years: 2.00    Pack years: 0.50    Types: Cigarettes   Smokeless tobacco: Never  Vaping Use   Vaping Use: Some days   Substances: Nicotine, Flavoring  Substance and Sexual Activity   Alcohol use: Yes    Comment: monthly   Drug use: No   Sexual activity: Not Currently    Birth control/protection: Condom, Injection  Other Topics Concern   Not on file  Social History Narrative   9th grade-homeschooled   Social Determinants of Health   Financial Resource Strain: Low Risk    Difficulty of  Paying Living Expenses: Not very hard  Food Insecurity: No Food Insecurity   Worried About Charity fundraiser in the Last Year: Never true   Ran Out of Food in the Last Year: Never true  Transportation Needs: No Transportation Needs   Lack of Transportation (Medical): No   Lack of Transportation (Non-Medical): No  Physical Activity: Inactive   Days of Exercise per Week: 0 days   Minutes of Exercise per Session: 0 min  Stress: Stress Concern Present   Feeling of Stress : Very much  Social Connections: Socially Isolated   Frequency of Communication with Friends and Family: Twice a week   Frequency of Social Gatherings with Friends and Family: Once a week   Attends Religious Services: Never   Marine scientist or Organizations: No   Attends Music therapist: Never   Marital Status: Never married  Human resources officer Violence: Not At Risk   Fear of Current or Ex-Partner: No   Emotionally Abused: No   Physically Abused: No   Sexually Abused: No    Outpatient Medications Prior to Visit  Medication Sig Dispense Refill   medroxyPROGESTERone (DEPO-PROVERA) 150 MG/ML injection Inject 1 mL (150 mg total) into the  muscle every 3 (three) months. 1 mL 4   ibuprofen (ADVIL) 200 MG tablet Take 600-800 mg by mouth every 6 (six) hours as needed for mild pain or moderate pain.     megestrol (MEGACE) 20 MG tablet Take 1 tablet (20 mg total) by mouth 2 (two) times daily. (Patient not taking: Reported on 08/25/2021) 14 tablet 0   No facility-administered medications prior to visit.    Allergies  Allergen Reactions   Strattera [Atomoxetine Hcl]     "ill" "made me angry"    Review of Systems  Constitutional: Negative.   HENT:  Positive for ear pain.        Left ear pain; pain when swallowing  Respiratory: Negative.    Cardiovascular: Negative.   Gastrointestinal:  Positive for abdominal pain.       Indigestion/reflux pain; had several dark, tarry stools   Psychiatric/Behavioral:         Seeing therapist      Objective:    Physical Exam Constitutional:      Appearance: Normal appearance.  HENT:     Right Ear: External ear normal. There is impacted cerumen.     Left Ear: External ear normal. There is impacted cerumen.     Mouth/Throat:     Mouth: Mucous membranes are dry.     Pharynx: Uvula midline. Posterior oropharyngeal erythema present. No oropharyngeal exudate or uvula swelling.     Tonsils: No tonsillar exudate or tonsillar abscesses. 1+ on the right. 1+ on the left.  Cardiovascular:     Rate and Rhythm: Normal rate and regular rhythm.     Pulses: Normal pulses.     Heart sounds: Normal heart sounds.  Pulmonary:     Effort: Pulmonary effort is normal.     Breath sounds: Normal breath sounds.  Abdominal:     General: Abdomen is flat. Bowel sounds are normal.     Palpations: Abdomen is soft.     Tenderness: There is abdominal tenderness.     Comments: Epigastric tenderness  Neurological:     Mental Status: She is alert.    BP 115/75 (BP Location: Left Arm, Patient Position: Sitting, Cuff Size: Large)   Pulse 82   Ht _0  (1.651 m)   Wt 177 lb (80.3 kg)   SpO2 98%   BMI 29.45 kg/m  Wt Readings from Last 3 Encounters:  08/25/21 177 lb (80.3 kg)  07/06/21 164 lb (74.4 kg)  06/25/21 176 lb (79.8 kg)    Health Maintenance Due  Topic Date Due   COVID-19 Vaccine (3 - Booster for Pfizer series) 10/06/2020   INFLUENZA VACCINE  07/31/2021    There are no preventive care reminders to display for this patient.   No results found for: TSH Lab Results  Component Value Date   WBC 11.2 (H) 05/24/2021   HGB 12.9 06/25/2021   HCT 38.0 06/25/2021   MCV 90.1 05/24/2021   PLT 280 05/24/2021   Lab Results  Component Value Date   NA 142 06/25/2021   K 3.4 (L) 06/25/2021   CO2 23 05/24/2021   GLUCOSE 97 06/25/2021   BUN 14 06/25/2021   CREATININE 1.00 06/25/2021   BILITOT 0.7 05/24/2021   ALKPHOS 66 05/24/2021    AST 21 05/24/2021   ALT 17 05/24/2021   PROT 8.3 (H) 05/24/2021   ALBUMIN 4.8 05/24/2021   CALCIUM 9.1 05/24/2021   ANIONGAP 8 05/24/2021   EGFR 113 03/23/2021   Lab Results  Component Value Date  CHOL 139 03/23/2021   Lab Results  Component Value Date   HDL 37 (L) 03/23/2021   Lab Results  Component Value Date   LDLCALC 89 03/23/2021   Lab Results  Component Value Date   TRIG 60 03/23/2021   Lab Results  Component Value Date   CHOLHDL 3.8 02/25/2019   Lab Results  Component Value Date   HGBA1C 5.8 (H) 01/07/2013       Assessment & Plan:   Problem List Items Addressed This Visit       Digestive   Gastroesophageal reflux - Primary    -taking omeprazole 20 mg BID; continue this -Rx. Famotidine -STOP ibuprofen; can use tylenol for pain -had a few dark/tarry stools- will get hemoccult cards- if positive, consider GI referral      Relevant Medications   famotidine (PEPCID) 20 MG tablet     Nervous and Auditory   Bilateral impacted cerumen    -irrigated today -with irrigation, she is still impacted and is having some pain -Rx. augmentin- will help sore throat and get rid of any infection, if it exists, that is causing pain with irrigation -Rx. Debrox drops -f/u in 1 week for irrigation      Relevant Medications   carbamide peroxide (DEBROX) 6.5 % OTIC solution     Other   Sore throat    -ongoing x 2 weeks -rapid strep today        Meds ordered this encounter  Medications   famotidine (PEPCID) 20 MG tablet    Sig: Take 1 tablet (20 mg total) by mouth 2 (two) times daily.    Dispense:  60 tablet    Refill:  1   carbamide peroxide (DEBROX) 6.5 % OTIC solution    Sig: Place 5 drops into both ears 2 (two) times daily.    Dispense:  15 mL    Refill:  0     Noreene Larsson, NP

## 2021-08-25 NOTE — Assessment & Plan Note (Signed)
-  ongoing x 2 weeks -rapid strep today

## 2021-08-25 NOTE — Addendum Note (Signed)
Addended by: Jerilynn Mages on: 08/25/2021 11:53 AM   Modules accepted: Orders

## 2021-08-25 NOTE — Assessment & Plan Note (Addendum)
-  irrigated today -with irrigation, she is still impacted and is having some pain -Rx. augmentin- will help sore throat and get rid of any infection, if it exists, that is causing pain with irrigation -Rx. Debrox drops -f/u in 1 week for irrigation

## 2021-08-25 NOTE — Assessment & Plan Note (Signed)
-  taking omeprazole 20 mg BID; continue this -Rx. Famotidine -STOP ibuprofen; can use tylenol for pain -had a few dark/tarry stools- will get hemoccult cards- if positive, consider GI referral

## 2021-08-25 NOTE — Patient Instructions (Addendum)
We will meet up in 1 week for cerumen irrigation.  Please return the hemoccult cards before that appointment. You should complete 1 card with stool from a unique BM. So, don't have 1 bowel movement and complete all 3 cards. Stool should come from 3 different BMs.

## 2021-09-01 ENCOUNTER — Encounter: Payer: Self-pay | Admitting: Nurse Practitioner

## 2021-09-01 ENCOUNTER — Other Ambulatory Visit: Payer: Self-pay

## 2021-09-01 ENCOUNTER — Ambulatory Visit: Payer: BC Managed Care – PPO | Admitting: Nurse Practitioner

## 2021-09-01 DIAGNOSIS — H6122 Impacted cerumen, left ear: Secondary | ICD-10-CM | POA: Insufficient documentation

## 2021-09-01 DIAGNOSIS — H6123 Impacted cerumen, bilateral: Secondary | ICD-10-CM | POA: Diagnosis not present

## 2021-09-01 MED ORDER — DEBROX 6.5 % OT SOLN: 5.0000 [drp] | Freq: Two times a day (BID) | OTIC | 0 refills | Status: DC

## 2021-09-01 NOTE — Assessment & Plan Note (Signed)
-  she was unable to get the debrox drops a the pharmacy (unsure why) -attempted irrigation today; was stopped at previous OV d/t pain -she had pain today, so I sent debrox drops to [place] pharmacy again

## 2021-09-01 NOTE — Progress Notes (Signed)
Acute Office Visit  Subjective:    Patient ID: Alison Mcmillan, female    DOB: Jul 01, 1996, 25 y.o.   MRN: [id number]  Chief Complaint  Patient presents with   Otalgia    Ear recheck-did not get the ear drops.    Otalgia   Patient is in today for bilateral cerumen impaction.   Past Medical History:  Diagnosis Date   Abnormal uterine bleeding 06/01/2016   ADD (attention deficit disorder)    Anxiety    Asthma    Constipation    Depression    Gastroesophageal reflux    Trichimoniasis 06/06/2021   06/06/21 rx flagyl POC    History reviewed. No pertinent surgical history.  Family History  Adopted: Yes  Problem Relation Age of Onset   Arthritis Sister    Depression Sister    ADD / ADHD Brother    Drug abuse Mother    Cholelithiasis Maternal Grandmother    Bipolar disorder Maternal Grandmother    Depression Maternal Grandmother    Heart disease Maternal Grandmother     Social History   Socioeconomic History   Marital status: Single    Spouse name: Not on file   Number of children: Not on file   Years of education: Not on file   Highest education level: Not on file  Occupational History   Occupation: [employer]    Comment: Engineer, maintenance (IT)  Tobacco Use   Smoking status: Some Days    Packs/day: 0.25    Years: 2.00    Pack years: 0.50    Types: Cigarettes   Smokeless tobacco: Never  Vaping Use   Vaping Use: Some days   Substances: Nicotine, Flavoring  Substance and Sexual Activity   Alcohol use: Yes    Comment: monthly   Drug use: No   Sexual activity: Not Currently    Birth control/protection: Condom, Injection  Other Topics Concern   Not on file  Social History Narrative   9th grade-homeschooled   Social Determinants of Health   Financial Resource Strain: Low Risk    Difficulty of Paying Living Expenses: Not very hard  Food Insecurity: No Food Insecurity   Worried About Charity fundraiser in the Last Year: Never true   Ran Out of Food in the Last Year:  Never true  Transportation Needs: No Transportation Needs   Lack of Transportation (Medical): No   Lack of Transportation (Non-Medical): No  Physical Activity: Inactive   Days of Exercise per Week: 0 days   Minutes of Exercise per Session: 0 min  Stress: Stress Concern Present   Feeling of Stress : Very much  Social Connections: Socially Isolated   Frequency of Communication with Friends and Family: Twice a week   Frequency of Social Gatherings with Friends and Family: Once a week   Attends Religious Services: Never   Marine scientist or Organizations: No   Attends Music therapist: Never   Marital Status: Never married  Human resources officer Violence: Not At Risk   Fear of Current or Ex-Partner: No   Emotionally Abused: No   Physically Abused: No   Sexually Abused: No    Outpatient Medications Prior to Visit  Medication Sig Dispense Refill   famotidine (PEPCID) 20 MG tablet Take 1 tablet (20 mg total) by mouth 2 (two) times daily. 60 tablet 1   medroxyPROGESTERone (DEPO-PROVERA) 150 MG/ML injection Inject 1 mL (150 mg total) into the muscle every 3 (three) months. 1 mL 4  megestrol (MEGACE) 20 MG tablet Take 1 tablet (20 mg total) by mouth 2 (two) times daily. 14 tablet 0   carbamide peroxide (DEBROX) 6.5 % OTIC solution Place 5 drops into both ears 2 (two) times daily. (Patient not taking: Reported on 09/01/2021) 15 mL 0   No facility-administered medications prior to visit.    Allergies  Allergen Reactions   Strattera [Atomoxetine Hcl]     "ill" "made me angry"    Review of Systems  Constitutional: Negative.   HENT:  Positive for ear pain.       Objective:    Physical Exam Constitutional:      Appearance: Normal appearance.  HENT:     Right Ear: Tympanic membrane, ear canal and external ear normal.     Left Ear: External ear normal. There is impacted cerumen.  Neurological:     Mental Status: She is alert.    BP 113/76 (BP Location: Left Arm,  Patient Position: Sitting, Cuff Size: Large)   Pulse 60   Temp 98.6 F (37 C) (Oral)   Ht _0  (1.651 m)   Wt 172 lb (78 kg)   SpO2 98%   BMI 28.62 kg/m  Wt Readings from Last 3 Encounters:  09/01/21 172 lb (78 kg)  08/25/21 177 lb (80.3 kg)  07/06/21 164 lb (74.4 kg)    There are no preventive care reminders to display for this patient.  There are no preventive care reminders to display for this patient.   No results found for: TSH Lab Results  Component Value Date   WBC 11.2 (H) 05/24/2021   HGB 12.9 06/25/2021   HCT 38.0 06/25/2021   MCV 90.1 05/24/2021   PLT 280 05/24/2021   Lab Results  Component Value Date   NA 142 06/25/2021   K 3.4 (L) 06/25/2021   CO2 23 05/24/2021   GLUCOSE 97 06/25/2021   BUN 14 06/25/2021   CREATININE 1.00 06/25/2021   BILITOT 0.7 05/24/2021   ALKPHOS 66 05/24/2021   AST 21 05/24/2021   ALT 17 05/24/2021   PROT 8.3 (H) 05/24/2021   ALBUMIN 4.8 05/24/2021   CALCIUM 9.1 05/24/2021   ANIONGAP 8 05/24/2021   EGFR 113 03/23/2021   Lab Results  Component Value Date   CHOL 139 03/23/2021   Lab Results  Component Value Date   HDL 37 (L) 03/23/2021   Lab Results  Component Value Date   LDLCALC 89 03/23/2021   Lab Results  Component Value Date   TRIG 60 03/23/2021   Lab Results  Component Value Date   CHOLHDL 3.8 02/25/2019   Lab Results  Component Value Date   HGBA1C 5.8 (H) 01/07/2013       Assessment & Plan:   Problem List Items Addressed This Visit       Nervous and Auditory   Hearing loss due to cerumen impaction, left    -she was unable to get the debrox drops a the pharmacy (unsure why) -attempted irrigation today; was stopped at previous OV d/t pain -she had pain today, so I sent debrox drops to [place] again      RESOLVED: Bilateral impacted cerumen   Relevant Medications   carbamide peroxide (DEBROX) 6.5 % OTIC solution     Meds ordered this encounter  Medications   carbamide peroxide  (DEBROX) 6.5 % OTIC solution    Sig: Place 5 drops into both ears 2 (two) times daily.    Dispense:  15 mL  Refill:  0     Noreene Larsson, NP

## 2021-09-08 ENCOUNTER — Encounter: Payer: Self-pay | Admitting: Nurse Practitioner

## 2021-09-08 ENCOUNTER — Other Ambulatory Visit: Payer: Self-pay

## 2021-09-08 ENCOUNTER — Ambulatory Visit: Payer: BC Managed Care – PPO | Admitting: Nurse Practitioner

## 2021-09-08 VITALS — BP 114/74 | HR 102 | Temp 98.5°F | Ht 65.0 in | Wt 172.0 lb

## 2021-09-08 DIAGNOSIS — H43399 Other vitreous opacities, unspecified eye: Secondary | ICD-10-CM | POA: Insufficient documentation

## 2021-09-08 DIAGNOSIS — F419 Anxiety disorder, unspecified: Secondary | ICD-10-CM

## 2021-09-08 DIAGNOSIS — Z23 Encounter for immunization: Secondary | ICD-10-CM

## 2021-09-08 DIAGNOSIS — H6122 Impacted cerumen, left ear: Secondary | ICD-10-CM | POA: Diagnosis not present

## 2021-09-08 DIAGNOSIS — F32A Depression, unspecified: Secondary | ICD-10-CM

## 2021-09-08 NOTE — Assessment & Plan Note (Signed)
-  she states her sister sees floaters before having a migraine, and she just started seeing floaters daily in the last few months -she will start with OTC excedrin migraine since she does not have debilitating headaches after seeing floaters -if excedrin doesn't help, she will see ophthalmology for eye exam -if eye exam is negative, we will consider neurology

## 2021-09-08 NOTE — Progress Notes (Addendum)
Acute Office Visit  Subjective:    Patient ID: Alison Mcmillan, female    DOB: Feb 16, 1996, 25 y.o.   MRN: [id number]  Chief Complaint  Patient presents with   Otalgia    L ear, not as painful as last visit. Has been using otc ear wax removal drops.    Headache    Has been seeing floaters intermittently for about a month, does not have any headache with these.     Otalgia  Associated symptoms include headaches.  Headache  Associated symptoms include ear pain.  Patient is in today for left cerumen impaction. Again, she didn't get the debrox drops. She did get a home earwax removal kit, and she states she was able to get some cerumen out.  She has anxiety and depression. She stopped fluozetine d/t an increase in panic attacks.  She states she took multiple ADHD medications as child, and she had a lot of side effects with thos.   Past Medical History:  Diagnosis Date   Abnormal uterine bleeding 06/01/2016   ADD (attention deficit disorder)    Anxiety    Asthma    Constipation    Depression    Gastroesophageal reflux    Trichimoniasis 06/06/2021   06/06/21 rx flagyl POC    History reviewed. No pertinent surgical history.  Family History  Adopted: Yes  Problem Relation Age of Onset   Arthritis Sister    Depression Sister    ADD / ADHD Brother    Drug abuse Mother    Cholelithiasis Maternal Grandmother    Bipolar disorder Maternal Grandmother    Depression Maternal Grandmother    Heart disease Maternal Grandmother     Social History   Socioeconomic History   Marital status: Single    Spouse name: Not on file   Number of children: Not on file   Years of education: Not on file   Highest education level: Not on file  Occupational History   Occupation: [employer]    Comment: Engineer, maintenance (IT)  Tobacco Use   Smoking status: Some Days    Packs/day: 0.25    Years: 2.00    Pack years: 0.50    Types: Cigarettes   Smokeless tobacco: Never  Vaping Use   Vaping Use: Some days    Substances: Nicotine, Flavoring  Substance and Sexual Activity   Alcohol use: Yes    Comment: monthly   Drug use: No   Sexual activity: Not Currently    Birth control/protection: Condom, Injection  Other Topics Concern   Not on file  Social History Narrative   9th grade-homeschooled   Social Determinants of Health   Financial Resource Strain: Low Risk    Difficulty of Paying Living Expenses: Not very hard  Food Insecurity: No Food Insecurity   Worried About Charity fundraiser in the Last Year: Never true   Ran Out of Food in the Last Year: Never true  Transportation Needs: No Transportation Needs   Lack of Transportation (Medical): No   Lack of Transportation (Non-Medical): No  Physical Activity: Inactive   Days of Exercise per Week: 0 days   Minutes of Exercise per Session: 0 min  Stress: Stress Concern Present   Feeling of Stress : Very much  Social Connections: Socially Isolated   Frequency of Communication with Friends and Family: Twice a week   Frequency of Social Gatherings with Friends and Family: Once a week   Attends Religious Services: Never   Retail buyer of [employer]  or Organizations: No   Attends Archivist Meetings: Never   Marital Status: Never married  Human resources officer Violence: Not At Risk   Fear of Current or Ex-Partner: No   Emotionally Abused: No   Physically Abused: No   Sexually Abused: No    Outpatient Medications Prior to Visit  Medication Sig Dispense Refill   famotidine (PEPCID) 20 MG tablet Take 1 tablet (20 mg total) by mouth 2 (two) times daily. 60 tablet 1   medroxyPROGESTERone (DEPO-PROVERA) 150 MG/ML injection Inject 1 mL (150 mg total) into the muscle every 3 (three) months. 1 mL 4   megestrol (MEGACE) 20 MG tablet Take 1 tablet (20 mg total) by mouth 2 (two) times daily. 14 tablet 0   carbamide peroxide (DEBROX) 6.5 % OTIC solution Place 5 drops into both ears 2 (two) times daily. (Patient not taking: Reported on 09/08/2021) 15 mL  0   No facility-administered medications prior to visit.    Allergies  Allergen Reactions   Strattera [Atomoxetine Hcl]     "ill" "made me angry"    Review of Systems  Constitutional: Negative.   HENT:  Positive for ear pain.   Neurological:  Positive for headaches.       Sees floaters daily      Objective:    Physical Exam Constitutional:      Appearance: She is well-developed.  HENT:     Right Ear: Tympanic membrane, ear canal and external ear normal.     Left Ear: Ear canal and external ear normal. There is impacted cerumen.  Neurological:     General: No focal deficit present.     Mental Status: She is alert and oriented to person, place, and time.     Cranial Nerves: No cranial nerve deficit.     Motor: No weakness.  Psychiatric:     Comments: PHQ-9 elevated    BP 114/74 (BP Location: Left Arm, Patient Position: Sitting, Cuff Size: Large)   Pulse (!) 102   Temp 98.5 F (36.9 C) (Oral)   Ht _0  (1.651 m)   Wt 172 lb (78 kg)   SpO2 98%   BMI 28.62 kg/m  Wt Readings from Last 3 Encounters:  09/08/21 172 lb (78 kg)  09/01/21 172 lb (78 kg)  08/25/21 177 lb (80.3 kg)    There are no preventive care reminders to display for this patient.  There are no preventive care reminders to display for this patient.   No results found for: TSH Lab Results  Component Value Date   WBC 11.2 (H) 05/24/2021   HGB 12.9 06/25/2021   HCT 38.0 06/25/2021   MCV 90.1 05/24/2021   PLT 280 05/24/2021   Lab Results  Component Value Date   NA 142 06/25/2021   K 3.4 (L) 06/25/2021   CO2 23 05/24/2021   GLUCOSE 97 06/25/2021   BUN 14 06/25/2021   CREATININE 1.00 06/25/2021   BILITOT 0.7 05/24/2021   ALKPHOS 66 05/24/2021   AST 21 05/24/2021   ALT 17 05/24/2021   PROT 8.3 (H) 05/24/2021   ALBUMIN 4.8 05/24/2021   CALCIUM 9.1 05/24/2021   ANIONGAP 8 05/24/2021   EGFR 113 03/23/2021   Lab Results  Component Value Date   CHOL 139 03/23/2021   Lab Results   Component Value Date   HDL 37 (L) 03/23/2021   Lab Results  Component Value Date   LDLCALC 89 03/23/2021   Lab Results  Component Value Date  TRIG 60 03/23/2021   Lab Results  Component Value Date   CHOLHDL 3.8 02/25/2019   Lab Results  Component Value Date   HGBA1C 5.8 (H) 01/07/2013       Assessment & Plan:   Problem List Items Addressed This Visit       Nervous and Auditory   Hearing loss due to cerumen impaction, left - Primary    -still didn't get debrox, despite counseling that it is available OTC at many pharmacies -she tried a home earwax removal kit, and states she got some wax out -after further investigation, the equate drops that she got were generic carbamide peroxide 6.5%, so she had generic debrox -irrigation attempted again today, but this was unsuccessful -referral to ENT for definitive treatment      Relevant Orders   Ambulatory referral to ENT     Other   Anxiety and depression    -has tried several meds in the apst, but can't recall them -she states she had twitching with prozac -referral to psych      Relevant Orders   Ambulatory referral to Psychiatry   Floaters in visual field, unspecified laterality    -she states her sister sees floaters before having a migraine, and she just started seeing floaters daily in the last few months -she will start with OTC excedrin migraine since she does not have debilitating headaches after seeing floaters -if excedrin doesn't help, she will see ophthalmology for eye exam -if eye exam is negative, we will consider neurology        No orders of the defined types were placed in this encounter.    Noreene Larsson, NP

## 2021-09-08 NOTE — Addendum Note (Signed)
Addended by: Bjorn Pippin on: 09/08/2021 09:43 AM   Modules accepted: Orders

## 2021-09-08 NOTE — Assessment & Plan Note (Addendum)
-  still didn't get debrox, despite counseling that it is available OTC at many pharmacies -she tried a home earwax removal kit, and states she got some wax out -after further investigation, the equate drops that she got were generic carbamide peroxide 6.5%, so she had generic debrox -irrigation attempted again today, but this was unsuccessful -referral to ENT for definitive treatment

## 2021-09-08 NOTE — Assessment & Plan Note (Signed)
-  has tried several meds in the apst, but can't recall them -she states she had twitching with prozac -referral to psych

## 2021-09-20 ENCOUNTER — Ambulatory Visit (INDEPENDENT_AMBULATORY_CARE_PROVIDER_SITE_OTHER): Payer: BC Managed Care – PPO | Admitting: Licensed Clinical Social Worker

## 2021-09-20 DIAGNOSIS — F32A Depression, unspecified: Secondary | ICD-10-CM

## 2021-09-20 DIAGNOSIS — F419 Anxiety disorder, unspecified: Secondary | ICD-10-CM

## 2021-09-29 NOTE — BH Specialist Note (Signed)
[place] [facility] Follow Up Assessment  MRN: [id number] NAME: Alison Mcmillan Date: 09/29/21  Start time: 1230 End time: 1250 Total time: 20  Type of Contact: Follow up Call  Current concerns/stressors: low mood  Screens/Assessment Tools: PHQ9 SCORE ONLY 09/29/2021 09/08/2021 09/01/2021  PHQ-9 Total Score 9 21 21     GAD 7 : Generalized Anxiety Score 09/29/2021 06/02/2021 03/23/2021  Nervous, Anxious, on Edge 3 3 3   Control/stop worrying 2 3 3   Worry too much - different things 2 2 3   Trouble relaxing 2 3 3   Restless 2 2 3   Easily annoyed or irritable 2 1 1   Afraid - awful might happen 2 1 1   Total GAD 7 Score 15 15 17   Anxiety Difficulty Somewhat difficult - -      Functional Assessment:  Sleep: fair Appetite: fair Coping ability: overwhelmed Patient taking medications as prescribed:  yes  Current medications:  Outpatient Encounter Medications as of 09/20/2021  Medication Sig   carbamide peroxide (DEBROX) 6.5 % OTIC solution Place 5 drops into both ears 2 (two) times daily. (Patient not taking: Reported on 09/08/2021)   famotidine (PEPCID) 20 MG tablet Take 1 tablet (20 mg total) by mouth 2 (two) times daily.   medroxyPROGESTERone (DEPO-PROVERA) 150 MG/ML injection Inject 1 mL (150 mg total) into the muscle every 3 (three) months.   megestrol (MEGACE) 20 MG tablet Take 1 tablet (20 mg total) by mouth 2 (two) times daily.   No facility-administered encounter medications on file as of 09/20/2021.    Self-harm and/or Suicidal Behaviors Risk Assessment Self-harm risk factors: no Patient endorses recent self injurious thoughts and/or behaviors: No   Suicide ideations: No plan to harm self or others   Danger to Others Risk Assessment Danger to others risk factors: no Patient endorses recent thoughts of harming others: No    Substance Use Assessment Patient recently consumed alcohol: No  Patient recently used drugs: No  Patient is concerned about dependence or abuse  of substances: No    Goals, Interventions and Follow-up Plan Goals: Increase healthy adjustment to current life circumstances Interventions: Mindfulness or Relaxation Training and CBT Cognitive Behavioral Therapy   Summary of Clinical Assessment  Alison Mcmillan is a 25 yr old woman referred by , NP. Alison Mcmillan presented for her initial session today for assistance with anxiety and depression.  Alison Mcmillan reports that she has had counseling in the past & medication management.  Alison Mcmillan reports taking medication in the past; meds unknown.  She reports bad side effects as twitching on Prozac.  She has sadness, poor motivation, low energy and anhedonia. She is open to [facility] and will schedule another session for next week   Follow-up Plan:  weekly [facility] session 11/08/2021, LCSW

## 2021-10-03 ENCOUNTER — Other Ambulatory Visit: Payer: Self-pay

## 2021-10-03 ENCOUNTER — Ambulatory Visit (INDEPENDENT_AMBULATORY_CARE_PROVIDER_SITE_OTHER): Payer: BC Managed Care – PPO | Admitting: Licensed Clinical Social Worker

## 2021-10-03 DIAGNOSIS — F419 Anxiety disorder, unspecified: Secondary | ICD-10-CM

## 2021-10-03 DIAGNOSIS — F32A Depression, unspecified: Secondary | ICD-10-CM

## 2021-10-03 NOTE — Progress Notes (Signed)
Failed attempt to reach due to phone disconnected

## 2021-10-04 ENCOUNTER — Other Ambulatory Visit: Payer: Self-pay

## 2021-10-04 ENCOUNTER — Ambulatory Visit (INDEPENDENT_AMBULATORY_CARE_PROVIDER_SITE_OTHER): Payer: BC Managed Care – PPO | Admitting: Otolaryngology

## 2021-10-04 DIAGNOSIS — H6123 Impacted cerumen, bilateral: Secondary | ICD-10-CM | POA: Diagnosis not present

## 2021-10-04 NOTE — Progress Notes (Signed)
HPI: Alison Mcmillan is a 25 y.o. female who presents is referred by her PCP for evaluation of wax buildup in ears with blockage of the left ear..  Past Medical History:  Diagnosis Date   Abnormal uterine bleeding 06/01/2016   ADD (attention deficit disorder)    Anxiety    Asthma    Constipation    Depression    Gastroesophageal reflux    Trichimoniasis 06/06/2021   06/06/21 rx flagyl POC   No past surgical history on file. Social History   Socioeconomic History   Marital status: Single    Spouse name: Not on file   Number of children: Not on file   Years of education: Not on file   Highest education level: Not on file  Occupational History   Occupation: [employer]    Comment: sorts trash  Tobacco Use   Smoking status: Some Days    Packs/day: 0.25    Years: 2.00    Pack years: 0.50    Types: Cigarettes   Smokeless tobacco: Never  Vaping Use   Vaping Use: Some days   Substances: Nicotine, Flavoring  Substance and Sexual Activity   Alcohol use: Yes    Comment: monthly   Drug use: No   Sexual activity: Not Currently    Birth control/protection: Condom, Injection  Other Topics Concern   Not on file  Social History Narrative   9th grade-homeschooled   Social Determinants of Health   Financial Resource Strain: Low Risk    Difficulty of Paying Living Expenses: Not very hard  Food Insecurity: No Food Insecurity   Worried About Programme researcher, broadcasting/film/video in the Last Year: Never true   Ran Out of Food in the Last Year: Never true  Transportation Needs: No Transportation Needs   Lack of Transportation (Medical): No   Lack of Transportation (Non-Medical): No  Physical Activity: Inactive   Days of Exercise per Week: 0 days   Minutes of Exercise per Session: 0 min  Stress: Stress Concern Present   Feeling of Stress : Very much  Social Connections: Socially Isolated   Frequency of Communication with Friends and Family: Twice a week   Frequency of Social Gatherings with Friends and  Family: Once a week   Attends Religious Services: Never   Database administrator or Organizations: No   Attends Engineer, structural: Never   Marital Status: Never married   Family History  Adopted: Yes  Problem Relation Age of Onset   Arthritis Sister    Depression Sister    ADD / ADHD Brother    Drug abuse Mother    Cholelithiasis Maternal Grandmother    Bipolar disorder Maternal Grandmother    Depression Maternal Grandmother    Heart disease Maternal Grandmother    Allergies  Allergen Reactions   Strattera [Atomoxetine Hcl]     "ill" "made me angry"   Prior to Admission medications   Medication Sig Start Date End Date Taking? Authorizing Provider  carbamide peroxide (DEBROX) 6.5 % OTIC solution Place 5 drops into both ears 2 (two) times daily. Patient not taking: Reported on 09/08/2021 09/01/21   Heather Roberts, NP  famotidine (PEPCID) 20 MG tablet Take 1 tablet (20 mg total) by mouth 2 (two) times daily. 08/25/21   Heather Roberts, NP  medroxyPROGESTERone (DEPO-PROVERA) 150 MG/ML injection Inject 1 mL (150 mg total) into the muscle every 3 (three) months. 06/02/21   Adline Potter, NP  megestrol (MEGACE) 20 MG tablet  Take 1 tablet (20 mg total) by mouth 2 (two) times daily. 05/24/21   Felicie Morn, NP  FLUoxetine (PROZAC) 20 MG capsule Take 1 capsule (20 mg total) by mouth daily. Patient not taking: Reported on 11/21/2019 10/08/19 11/22/19  Jacquelin Hawking, PA-C     Positive ROS: Otherwise negative  All other systems have been reviewed and were otherwise negative with the exception of those mentioned in the HPI and as above.  Physical Exam: Constitutional: Alert, well-appearing, no acute distress Ears: External ears without lesions or tenderness. Ear canals with wax buildup on both sides worse on the left side.  This was cleaned with suction and hydrogen peroxide.  TMs were otherwise clear. Nasal: External nose without lesions. . Clear nasal passages Oral: Lips and  gums without lesions. Tongue and palate mucosa without lesions. Posterior oropharynx clear. Neck: No palpable adenopathy or masses Respiratory: Breathing comfortably  Skin: No facial/neck lesions or rash noted.  Cerumen impaction removal  Date/Time: 10/04/2021 9:58 AM Performed by: Drema Halon, MD Authorized by: Drema Halon, MD   Consent:    Consent obtained:  Verbal   Consent given by:  Patient   Risks discussed:  Pain and bleeding Procedure details:    Location:  L ear and R ear   Procedure type: curette and suction   Post-procedure details:    Inspection:  TM intact and canal normal   Hearing quality:  Improved   Procedure completion:  Tolerated well, no immediate complications Comments:     Left ear canal was completely occluded with cerumen that was cleaned with suction and hydroperoxide.  Small amount of wax on the right side that was cleaned with suction and curette.  TMs were clear bilaterally.  Assessment: Bilateral cerumen impactions worse on the left side  Plan: This was cleaned in the office with improved hearing on the left side. She will follow-up as needed.   Narda Bonds, MD   CC:

## 2021-10-05 DIAGNOSIS — Z20822 Contact with and (suspected) exposure to covid-19: Secondary | ICD-10-CM | POA: Diagnosis not present

## 2021-11-15 ENCOUNTER — Ambulatory Visit: Payer: BC Managed Care – PPO | Admitting: Nurse Practitioner

## 2021-11-16 ENCOUNTER — Other Ambulatory Visit: Payer: Self-pay

## 2021-11-16 ENCOUNTER — Ambulatory Visit: Payer: BC Managed Care – PPO | Admitting: Nurse Practitioner

## 2021-11-16 ENCOUNTER — Ambulatory Visit: Payer: BC Managed Care – PPO

## 2021-11-16 ENCOUNTER — Encounter: Payer: Self-pay | Admitting: Nurse Practitioner

## 2021-11-16 DIAGNOSIS — J101 Influenza due to other identified influenza virus with other respiratory manifestations: Secondary | ICD-10-CM | POA: Diagnosis not present

## 2021-11-16 DIAGNOSIS — R051 Acute cough: Secondary | ICD-10-CM | POA: Diagnosis not present

## 2021-11-16 LAB — POCT INFLUENZA A/B
Influenza A, POC: POSITIVE — AB
Influenza B, POC: NEGATIVE

## 2021-11-16 MED ORDER — OSELTAMIVIR PHOSPHATE 75 MG PO CAPS
75.0000 mg | ORAL_CAPSULE | Freq: Two times a day (BID) | ORAL | 0 refills | Status: DC
Start: 2021-11-16 — End: 2021-11-21

## 2021-11-16 NOTE — Assessment & Plan Note (Signed)
Tamiflu 750mg  BID for 5 days.  OTC tylenol as needed for fever Mucinex as needed for cough. Stay on isolation until free of fever for 24 hours .

## 2021-11-16 NOTE — Addendum Note (Signed)
Addended by: Donell Beers on: 11/16/2021 09:38 PM   Modules accepted: Level of Service
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
## 2021-11-18 LAB — SARS-COV-2, NAA 2 DAY TAT

## 2021-11-18 LAB — NOVEL CORONAVIRUS, NAA: SARS-CoV-2, NAA: NOT DETECTED

## 2021-11-20 ENCOUNTER — Telehealth: Payer: Self-pay | Admitting: Nurse Practitioner

## 2021-11-20 NOTE — Telephone Encounter (Signed)
Pt called in still having flu complications.   Pt states she has taking the meds given from tele visit on 11/17 ans is not feeling better  Pt states that she cant keep any food / drink down .

## 2021-11-21 ENCOUNTER — Encounter: Payer: Self-pay | Admitting: Nurse Practitioner

## 2021-11-21 ENCOUNTER — Ambulatory Visit: Payer: BC Managed Care – PPO | Admitting: Nurse Practitioner

## 2021-11-21 ENCOUNTER — Other Ambulatory Visit: Payer: Self-pay

## 2021-11-21 VITALS — BP 127/90 | HR 113 | Temp 97.9°F | Ht 65.0 in | Wt 163.0 lb

## 2021-11-21 DIAGNOSIS — R0981 Nasal congestion: Secondary | ICD-10-CM | POA: Diagnosis not present

## 2021-11-21 DIAGNOSIS — R051 Acute cough: Secondary | ICD-10-CM

## 2021-11-21 DIAGNOSIS — R110 Nausea: Secondary | ICD-10-CM | POA: Diagnosis not present

## 2021-11-21 MED ORDER — SALINE SPRAY 0.65 % NA SOLN: 2.0000 | NASAL | 0 refills | Status: DC | PRN

## 2021-11-21 MED ORDER — PROMETHAZINE-DM 6.25-15 MG/5ML PO SYRP: 5.0000 mL | ORAL_SOLUTION | Freq: Four times a day (QID) | ORAL | 0 refills | Status: DC | PRN

## 2021-11-21 NOTE — Assessment & Plan Note (Signed)
Use saline nasal spray PRN. Drink plenty of fluid.

## 2021-11-21 NOTE — Assessment & Plan Note (Addendum)
Recently treated for Flu.  Promethazine DM ordered.  Drink plenty of fluids to stay hydrated.

## 2021-11-21 NOTE — Telephone Encounter (Signed)
Patient has been scheduled for a work in appointment at 11:40am this morning with you

## 2021-11-21 NOTE — Progress Notes (Signed)
   Alison Mcmillan     MRN: [id number]      DOB: 1996/09/30   HPI Alison Mcmillan is here for for c/o of productive cough , chest congestion, nasal congestion, throat pain, nausea and vomiting, that started last week. She was recently treated for flu. She sometimes have headache, clear nasal drainage. Has been having 2 diarrhea daily. . Mucinex helps her congestion but the cough still bothers her  with N/V.   ROS Denies recent fever or chills. Has headache Has sinus pressure, nasal congestion, sore throat.  Has chest congestion, productive cough no wheezing, no SOB Denies chest pains, palpitations and leg swelling Denies abdominal pain, has nausea, vomiting,diarrhea Denies dysuria, frequency, hesitancy or incontinence.   PE  BP 127/90 (BP Location: Right Arm, Patient Position: Sitting, Cuff Size: Normal)   Pulse (!) 113   Temp 97.9 F (36.6 C) (Oral)   Ht 5\' 5"  (1.651 m)   Wt 163 lb (73.9 kg)   SpO2 94%   BMI 27.12 kg/m   Patient alert and oriented and in no cardiopulmonary distress.  HEENT: No facial asymmetry, EOMI,     Neck supple , positive maxillary sinus tenderness on palpation  Chest: Rhonchi bilaterally.  CVS: S1, S2 no murmurs, no S3.Regular rate.  ABD: Soft non tender.   Ext: No edema  MS: Adequate ROM spine, shoulders, hips and knees.  Psych: Good eye contact, normal affect. Memory intact not anxious or depressed appearing.  CNS: CN 2-12 intact, power,  normal throughout.no focal deficits noted.   Assessment & Plan

## 2021-11-21 NOTE — Patient Instructions (Signed)
Please take tylenol as needed for head ache. Drink plenty of fluid to stay hydrated. Use nasal spray as needed for congestion  Thanks for choosing [facility] for your health care needs. We appreciate the opportunity to serve you.

## 2021-11-21 NOTE — Assessment & Plan Note (Signed)
Promethazine DM ordered

## 2021-11-27 NOTE — Addendum Note (Signed)
Addended by: Donell Beers on: 11/27/2021 10:03 PM   Modules accepted: Level of Service

## 2022-01-10 ENCOUNTER — Encounter: Payer: Self-pay | Admitting: Nurse Practitioner

## 2022-01-10 ENCOUNTER — Ambulatory Visit: Payer: BC Managed Care – PPO

## 2022-01-10 ENCOUNTER — Other Ambulatory Visit: Payer: Self-pay

## 2022-01-10 ENCOUNTER — Ambulatory Visit (INDEPENDENT_AMBULATORY_CARE_PROVIDER_SITE_OTHER): Payer: BC Managed Care – PPO | Admitting: Nurse Practitioner

## 2022-01-10 DIAGNOSIS — F32A Depression, unspecified: Secondary | ICD-10-CM

## 2022-01-10 DIAGNOSIS — F419 Anxiety disorder, unspecified: Secondary | ICD-10-CM

## 2022-01-10 DIAGNOSIS — J309 Allergic rhinitis, unspecified: Secondary | ICD-10-CM | POA: Diagnosis not present

## 2022-01-10 DIAGNOSIS — J45909 Unspecified asthma, uncomplicated: Secondary | ICD-10-CM | POA: Diagnosis not present

## 2022-01-10 DIAGNOSIS — R051 Acute cough: Secondary | ICD-10-CM | POA: Diagnosis not present

## 2022-01-10 MED ORDER — FLUTICASONE PROPIONATE 50 MCG/ACT NA SUSP: 2.0000 | Freq: Every day | NASAL | 6 refills | Status: DC

## 2022-01-10 MED ORDER — ALBUTEROL SULFATE HFA 108 (90 BASE) MCG/ACT IN AERS: 2.0000 | INHALATION_SPRAY | Freq: Four times a day (QID) | RESPIRATORY_TRACT | 2 refills | Status: DC | PRN

## 2022-01-10 MED ORDER — ESCITALOPRAM OXALATE 5 MG PO TABS: 5.0000 mg | ORAL_TABLET | Freq: Every day | ORAL | 1 refills | Status: DC

## 2022-01-10 MED ORDER — PROMETHAZINE-DM 6.25-15 MG/5ML PO SYRP: 5.0000 mL | ORAL_SOLUTION | Freq: Four times a day (QID) | ORAL | 0 refills | Status: DC | PRN

## 2022-01-10 MED ORDER — CETIRIZINE HCL 10 MG PO TABS: 10.0000 mg | ORAL_TABLET | Freq: Every day | ORAL | 11 refills | Status: DC

## 2022-01-10 NOTE — Assessment & Plan Note (Addendum)
She has tried different meds before but they did not help, she was seeing a therapist at [facility], that was not effective either.  GAD7-20, PHQ9 :12  RX lexapro 5mg   follow up in 6 weeks  refused referral to psych today.

## 2022-01-10 NOTE — Assessment & Plan Note (Signed)
Allergic rhinitis : zyrtec 10mg  daily Flonase nasal spray.

## 2022-01-10 NOTE — Progress Notes (Addendum)
Virtual Visit via Telephone Note  I connected with Alison Mcmillan  on 01/10/22 at 1040 by telephone and verified that I am speaking with the correct person using two identifiers. I spent 14 minutes talking to the pt and reviewing her chart.   Location: Patient: home Provider: office   I discussed the limitations, risks, security and privacy concerns of performing an evaluation and management service by telephone and the availability of in person appointments. I also discussed with the patient that there may be a patient responsible charge related to this service. The patient expressed understanding and agreed to proceed.   History of Present Illness: Pt c/o  non productive cough,chest congestion,  sneezing, itchy watery eyes, itchy nose, runny nose, post nasal drainage, headache 8/10, sore throat, a little SOB, symptoms started on 1/8, denies fever, wheezing , chest pain, body aches, bloody sputum. She has been taking OTC sinus medication. Takes tylenol cold and flu relief from [facility] helps a little. She has had her flu and covid vaccine, has no known exposure to flu and covid. Pt is a current smoker.  Depression and anxiety: pt c/o depression and anxiety, she has tried Zoloft in the past but it gave her panic attacks so she stopped using the med, she was also seeing therapist at work, therapy was not effective. She is willing to start lexapro today . Pt denies SI, HI.     Observations/Objective:   Assessment and Plan: Allergic rhinitis : zyrtec 10mg  daily Flonase nasal spray.  Cough : will obtain covid and flu test. . Pt advised to quit smoking, risk of lung cancer and respiratory diseases discussed with pt , she verbalized understanding.    Depression and anxiety: RX leaxpro 5mg  daily. Follow up in 6 weeks, GAD 20, PHQ 9 score 12.   Follow Up Instructions:    I discussed the assessment and treatment plan with the patient. The patient was provided an opportunity to ask questions  and all were answered. The patient agreed with the plan and demonstrated an understanding of the instructions.   The patient was advised to call back or seek an in-person evaluation if the symptoms worsen or if the condition fails to improve as anticipated.

## 2022-01-10 NOTE — Assessment & Plan Note (Signed)
RX Albuterol prn SOB.

## 2022-01-10 NOTE — Addendum Note (Signed)
Addended by: Donell Beers on: 01/10/2022 12:32 PM   Modules accepted: Orders, Level of Service

## 2022-01-10 NOTE — Assessment & Plan Note (Signed)
Cough : will obtain covid and flu test. . Pt advised to quit smoking, risk of lung cancer and respiratory diseases discussed with pt , she verbalized understanding.

## 2022-02-15 ENCOUNTER — Encounter (HOSPITAL_COMMUNITY): Payer: Self-pay | Admitting: Emergency Medicine

## 2022-02-15 ENCOUNTER — Emergency Department (HOSPITAL_COMMUNITY)
Admission: EM | Admit: 2022-02-15 | Discharge: 2022-02-15 | Disposition: A | Discharge status: Home / Self Care | Payer: BC Managed Care – PPO | Attending: Student | Admitting: Student

## 2022-02-15 ENCOUNTER — Other Ambulatory Visit: Payer: Self-pay

## 2022-02-15 DIAGNOSIS — F17210 Nicotine dependence, cigarettes, uncomplicated: Secondary | ICD-10-CM | POA: Diagnosis not present

## 2022-02-15 DIAGNOSIS — R55 Syncope and collapse: Secondary | ICD-10-CM | POA: Insufficient documentation

## 2022-02-15 DIAGNOSIS — Z79899 Other long term (current) drug therapy: Secondary | ICD-10-CM | POA: Insufficient documentation

## 2022-02-15 DIAGNOSIS — R519 Headache, unspecified: Secondary | ICD-10-CM | POA: Insufficient documentation

## 2022-02-15 DIAGNOSIS — J45909 Unspecified asthma, uncomplicated: Secondary | ICD-10-CM | POA: Insufficient documentation

## 2022-02-15 DIAGNOSIS — N3090 Cystitis, unspecified without hematuria: Secondary | ICD-10-CM | POA: Diagnosis not present

## 2022-02-15 DIAGNOSIS — Z20822 Contact with and (suspected) exposure to covid-19: Secondary | ICD-10-CM | POA: Diagnosis not present

## 2022-02-15 DIAGNOSIS — R5383 Other fatigue: Secondary | ICD-10-CM | POA: Insufficient documentation

## 2022-02-15 DIAGNOSIS — Z7951 Long term (current) use of inhaled steroids: Secondary | ICD-10-CM | POA: Diagnosis not present

## 2022-02-15 LAB — URINALYSIS, ROUTINE W REFLEX MICROSCOPIC
Bacteria, UA: NONE SEEN
Bilirubin Urine: NEGATIVE
Glucose, UA: NEGATIVE mg/dL
Hgb urine dipstick: NEGATIVE
Ketones, ur: 5 mg/dL — AB
Nitrite: NEGATIVE
Protein, ur: NEGATIVE mg/dL
Specific Gravity, Urine: 1.021 (ref 1.005–1.030)
pH: 6 (ref 5.0–8.0)

## 2022-02-15 LAB — RAPID URINE DRUG SCREEN, HOSP PERFORMED
Amphetamines: NOT DETECTED
Barbiturates: NOT DETECTED
Benzodiazepines: NOT DETECTED
Cocaine: NOT DETECTED
Opiates: NOT DETECTED
Tetrahydrocannabinol: POSITIVE — AB

## 2022-02-15 LAB — COMPREHENSIVE METABOLIC PANEL
ALT: 17 U/L (ref 0–44)
AST: 20 U/L (ref 15–41)
Albumin: 4.2 g/dL (ref 3.5–5.0)
Alkaline Phosphatase: 43 U/L (ref 38–126)
Anion gap: 11 (ref 5–15)
BUN: 14 mg/dL (ref 6–20)
CO2: 19 mmol/L — ABNORMAL LOW (ref 22–32)
Calcium: 8.8 mg/dL — ABNORMAL LOW (ref 8.9–10.3)
Chloride: 107 mmol/L (ref 98–111)
Creatinine, Ser: 0.68 mg/dL (ref 0.44–1.00)
GFR, Estimated: >60 mL/min (ref >60)
Glucose, Bld: 98 mg/dL (ref 70–99)
Potassium: 3.6 mmol/L (ref 3.5–5.1)
Sodium: 137 mmol/L (ref 135–145)
Total Bilirubin: 0.7 mg/dL (ref 0.3–1.2)
Total Protein: 7.1 g/dL (ref 6.5–8.1)

## 2022-02-15 LAB — RESP PANEL BY RT-PCR (FLU A&B, COVID) ARPGX2
Influenza A by PCR: NEGATIVE
Influenza B by PCR: NEGATIVE
SARS Coronavirus 2 by RT PCR: NEGATIVE

## 2022-02-15 LAB — CBC WITH DIFFERENTIAL/PLATELET
Abs Immature Granulocytes: 0.02 10*3/uL (ref 0.00–0.07)
Basophils Absolute: 0 10*3/uL (ref 0.0–0.1)
Basophils Relative: 0 %
Eosinophils Absolute: 0 10*3/uL (ref 0.0–0.5)
Eosinophils Relative: 0 %
HCT: 36.3 % (ref 36.0–46.0)
Hemoglobin: 12.5 g/dL (ref 12.0–15.0)
Immature Granulocytes: 0 %
Lymphocytes Relative: 26 %
Lymphs Abs: 1.7 10*3/uL (ref 0.7–4.0)
MCH: 30.9 pg (ref 26.0–34.0)
MCHC: 34.4 g/dL (ref 30.0–36.0)
MCV: 89.9 fL (ref 80.0–100.0)
Monocytes Absolute: 0.5 10*3/uL (ref 0.1–1.0)
Monocytes Relative: 7 %
Neutro Abs: 4.3 10*3/uL (ref 1.7–7.7)
Neutrophils Relative %: 67 %
Platelets: 242 10*3/uL (ref 150–400)
RBC: 4.04 MIL/uL (ref 3.87–5.11)
RDW: 13.2 % (ref 11.5–15.5)
WBC: 6.6 10*3/uL (ref 4.0–10.5)
nRBC: 0 % (ref 0.0–0.2)

## 2022-02-15 LAB — CBG MONITORING, ED: Glucose-Capillary: 100 mg/dL — ABNORMAL HIGH (ref 70–99)

## 2022-02-15 LAB — TROPONIN I (HIGH SENSITIVITY)
Troponin I (High Sensitivity): <2 ng/L (ref <18)
Troponin I (High Sensitivity): <2 ng/L (ref <18)

## 2022-02-15 LAB — PREGNANCY, URINE: Preg Test, Ur: NEGATIVE

## 2022-02-15 MED ORDER — PROCHLORPERAZINE EDISYLATE 10 MG/2ML IJ SOLN
10.0000 mg | Freq: Once | INTRAMUSCULAR | Status: AC
  Administered 2022-02-15: 10 mg via INTRAVENOUS
  Filled 2022-02-15: qty 2

## 2022-02-15 MED ORDER — LACTATED RINGERS IV BOLUS
1000.0000 mL | Freq: Once | INTRAVENOUS | Status: AC
  Administered 2022-02-15: 1000 mL via INTRAVENOUS

## 2022-02-15 MED ORDER — CEFADROXIL 500 MG PO CAPS: 500.0000 mg | ORAL_CAPSULE | Freq: Two times a day (BID) | ORAL | 0 refills | Status: AC

## 2022-02-15 MED ORDER — DIPHENHYDRAMINE HCL 50 MG/ML IJ SOLN
25.0000 mg | Freq: Once | INTRAMUSCULAR | Status: AC
  Administered 2022-02-15: 25 mg via INTRAVENOUS
  Filled 2022-02-15: qty 1

## 2022-02-15 NOTE — ED Provider Notes (Signed)
[facility] EMERGENCY DEPARTMENT Provider Note  CSN: [id number] Arrival date & time: 02/15/22 [id number]  Chief Complaint(s) Loss of Consciousness  HPI Alison Mcmillan is a 26 y.o. female with PMH anxiety, asthma, constipation who presents emergency department for loss of consciousness.  Patient states that over the last 48 hours she has had multiple episodes of presyncope but no true syncope.  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  She felt hot and clammy and lowered herself to the ground.  She states that she had an additional episode while in the car this morning on the way to work where she was minimally responsive with her significant other but would awake with loud and noxious stimuli.  She states that she feels generally fatigued but denies chest pain, shortness of breath, fever, dysuria, increased frequency, vaginal bleeding, vaginal discharge or other systemic symptoms.  She does endorse a headache today.   Loss of Consciousness Associated symptoms: headaches    Past Medical History Past Medical History:  Diagnosis Date   Abnormal uterine bleeding 06/01/2016   ADD (attention deficit disorder)    Anxiety    Asthma    Constipation    Depression    Gastroesophageal reflux    Trichimoniasis 06/06/2021   06/06/21 rx flagyl POC   Patient Active Problem List   Diagnosis Date Noted   Acute cough 11/21/2021   Nasal congestion 11/21/2021   Nausea 11/21/2021   Influenza A 11/16/2021   Floaters in visual field, unspecified laterality 09/08/2021   Hearing loss due to cerumen impaction, left 09/01/2021   Sore throat 08/25/2021   Pelvic pain 07/06/2021   Trichimoniasis 06/06/2021   Dysmenorrhea 06/02/2021   Menorrhagia with irregular cycle 06/02/2021   Encounter for gynecological examination with Papanicolaou smear of cervix 06/02/2021   Encounter for initial prescription of injectable contraceptive  06/02/2021   Anxiety and depression 06/02/2021   Encounter to establish care 03/23/2021   Screening due 03/23/2021   Anxiety 03/23/2021   Irritable bowel syndrome with diarrhea 05/21/2016   ADHD (attention deficit hyperactivity disorder) 01/20/2014   Depression 11/24/2013   Allergic rhinitis 11/18/2013   Asthma 11/04/2013   Family history of diabetes mellitus 01/20/2013   Gastroesophageal reflux    Home Medication(s) Prior to Admission medications   Medication Sig Start Date End Date Taking? Authorizing Provider  escitalopram (LEXAPRO) 5 MG tablet Take 1 tablet (5 mg total) by mouth daily. 01/10/22  Yes Paseda, Dewaine Conger, FNP  albuterol (VENTOLIN HFA) 108 (90 Base) MCG/ACT inhaler Inhale 2 puffs into the lungs every 6 (six) hours as needed for wheezing or shortness of breath. 01/10/22   Paseda, Dewaine Conger, FNP  cetirizine (ZYRTEC) 10 MG tablet Take 1 tablet (10 mg total) by mouth daily. 01/10/22   Paseda, Dewaine Conger, FNP  famotidine (PEPCID) 20 MG tablet Take 1 tablet (20 mg total) by mouth 2 (two) times daily. 08/25/21   Noreene Larsson, NP  fluticasone Asencion Islam) 50 MCG/ACT nasal spray Place 2 sprays into both nostrils daily. 01/10/22   Renee Rival, FNP  medroxyPROGESTERone (DEPO-PROVERA) 150 MG/ML injection Inject 1 mL (150 mg total) into the muscle every 3 (three) months. Patient not taking: Reported on 11/16/2021 06/02/21   Estill Dooms, NP  promethazine-dextromethorphan (PROMETHAZINE-DM) 6.25-15 MG/5ML syrup Take 5 mLs by mouth 4 (four) times daily as needed for cough. 01/10/22   Renee Rival, FNP  FLUoxetine (  PROZAC) 20 MG capsule Take 1 capsule (20 mg total) by mouth daily. Patient not taking: Reported on 11/21/2019 10/08/19 11/22/19  Soyla Dryer, PA-C                                                                                                                                    Past Surgical History History reviewed. No pertinent surgical history. Family  History Family History  Adopted: Yes  Problem Relation Age of Onset   Arthritis Sister    Depression Sister    ADD / ADHD Brother    Drug abuse Mother    Cholelithiasis Maternal Grandmother    Bipolar disorder Maternal Grandmother    Depression Maternal Grandmother    Heart disease Maternal Grandmother     Social History Social History   Tobacco Use   Smoking status: Some Days    Packs/day: 0.25    Years: 2.00    Pack years: 0.50    Types: Cigarettes   Smokeless tobacco: Never  Vaping Use   Vaping Use: Some days   Substances: Nicotine, Flavoring  Substance Use Topics   Alcohol use: Yes    Comment: monthly   Drug use: No   Allergies Strattera [atomoxetine hcl]  Review of Systems Review of Systems  Constitutional:  Positive for fatigue.  Cardiovascular:  Positive for syncope.  Neurological:  Positive for syncope and headaches.   Physical Exam Vital Signs  I have reviewed the triage vital signs BP 121/82 (BP Location: Left Arm)    Pulse 65    Temp 98.4 F (36.9 C) (Oral)    Resp 20    Ht 5\' 5"  (1.651 m)    Wt 73.9 kg    SpO2 99%    BMI 27.11 kg/m   Physical Exam Vitals and nursing note reviewed.  Constitutional:      General: She is not in acute distress.    Appearance: She is well-developed.  HENT:     Head: Normocephalic and atraumatic.  Eyes:     Conjunctiva/sclera: Conjunctivae normal.  Cardiovascular:     Rate and Rhythm: Normal rate and regular rhythm.     Heart sounds: No murmur heard. Pulmonary:     Effort: Pulmonary effort is normal. No respiratory distress.     Breath sounds: Normal breath sounds.  Abdominal:     Palpations: Abdomen is soft.     Tenderness: There is abdominal tenderness (suprapubic).  Musculoskeletal:        General: No swelling.     Cervical back: Neck supple.  Skin:    General: Skin is warm and dry.     Capillary Refill: Capillary refill takes less than 2 seconds.  Neurological:     Mental Status: She is alert.   Psychiatric:        Mood and Affect: Mood normal.    ED Results and Treatments Labs (all labs ordered are listed, but only  abnormal results are displayed) Labs Reviewed  CBC WITH DIFFERENTIAL/PLATELET  COMPREHENSIVE METABOLIC PANEL  URINALYSIS, ROUTINE W REFLEX MICROSCOPIC  RAPID URINE DRUG SCREEN, HOSP PERFORMED  PREGNANCY, URINE  TROPONIN I (HIGH SENSITIVITY)                                                                                                                          Radiology No results found.  Pertinent labs & imaging results that were available during my care of the patient were reviewed by me and considered in my medical decision making (see MDM for details).  Medications Ordered in ED Medications  prochlorperazine (COMPAZINE) injection 10 mg (has no administration in time range)  diphenhydrAMINE (BENADRYL) injection 25 mg (has no administration in time range)  lactated ringers bolus 1,000 mL (has no administration in time range)                                                                                                                                     Procedures Procedures  (including critical care time)  Medical Decision Making / ED Course   This patient presents to the ED for concern of presyncope, headache, this involves an extensive number of treatment options, and is a complaint that carries with it a high risk of complications and morbidity.  The differential diagnosis includes vasovagal syncope, orthostatic syncope, cardiogenic syncope, illicit substance use, viral URI, cystitis, migraine  MDM: Patient seen emergency department for evaluation of presyncope and headache.  Physical exam surprisingly reveals significant suprapubic tenderness to palpation but is otherwise unremarkable.  Laboratory evaluation with CO2 of 19 but is otherwise unremarkable, COVID and flu negative, pregnancy test negative, troponin negative, urinalysis with small leuk  esterase and 11-20 white blood cells.  No bacteria seen and 11-20 squamous epithelial cells also seen, but due to patient's symptomatic presentation on exam, we will elect to treat with a short course of [place].  ECG nonischemic.  With no evidence of dysrhythmia.  Patient was given a headache cocktail and on reevaluation her symptoms had completely resolved.  Patient states she is feeling much better and with negative work-up here in the emergency department she is safe for discharge.  Patient will follow-up with her outpatient PCP.  She is given return precautions which she voiced understanding she was discharged.   Additional history obtained: -Additional history obtained from significant other -External records from  outside source obtained and reviewed including: Chart review including previous notes, labs, imaging, consultation notes   Lab Tests: -I ordered, reviewed, and interpreted labs.   The pertinent results include:   Labs Reviewed  CBC WITH DIFFERENTIAL/PLATELET  COMPREHENSIVE METABOLIC PANEL  URINALYSIS, ROUTINE W REFLEX MICROSCOPIC  RAPID URINE DRUG SCREEN, HOSP PERFORMED  PREGNANCY, URINE  TROPONIN I (HIGH SENSITIVITY)      EKG   EKG Interpretation  Date/Time:  Thursday February 15 2022 07:32:28 EST Ventricular Rate:  65 PR Interval:  178 QRS Duration: 96 QT Interval:  412 QTC Calculation: 429 R Axis:   84 Text Interpretation: Sinus rhythm Confirmed by Noelie Renfrow ([id number]) on 02/15/2022 7:39:07 AM        Medicines ordered and prescription drug management: Meds ordered this encounter  Medications   prochlorperazine (COMPAZINE) injection 10 mg   diphenhydrAMINE (BENADRYL) injection 25 mg   lactated ringers bolus 1,000 mL    -I have reviewed the patients home medicines and have made adjustments as needed  Critical interventions none  Cardiac Monitoring: The patient was maintained on a cardiac monitor.  I personally viewed and interpreted the cardiac  monitored which showed an underlying rhythm of: NSR  Social Determinants of Health:  Factors impacting patients care include: none   Reevaluation: After the interventions noted above, I reevaluated the patient and found that they have :improved  Co morbidities that complicate the patient evaluation  Past Medical History:  Diagnosis Date   Abnormal uterine bleeding 06/01/2016   ADD (attention deficit disorder)    Anxiety    Asthma    Constipation    Depression    Gastroesophageal reflux    Trichimoniasis 06/06/2021   06/06/21 rx flagyl POC      Dispostion: I considered admission for this patient, but she does not meet admission criteria with negative work-up and she is safe for discharge with outpatient follow-up.     Final Clinical Impression(s) / ED Diagnoses Final diagnoses:  None     @PCDICTATION @    Teressa Lower, MD 02/15/22 [phone]
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
## 2022-02-17 LAB — URINE CULTURE

## 2022-02-21 ENCOUNTER — Other Ambulatory Visit: Payer: Self-pay

## 2022-02-21 ENCOUNTER — Telehealth: Payer: Self-pay

## 2022-02-21 DIAGNOSIS — F419 Anxiety disorder, unspecified: Secondary | ICD-10-CM

## 2022-02-21 MED ORDER — ESCITALOPRAM OXALATE 5 MG PO TABS: 5.0000 mg | ORAL_TABLET | Freq: Every day | ORAL | 1 refills | Status: DC

## 2022-02-21 NOTE — Telephone Encounter (Signed)
Refills sent

## 2022-02-21 NOTE — Telephone Encounter (Signed)
Patient called need med refill  Escitaloprma (Lexapro) 5 mg  Pharmacy: [facility]

## 2022-02-23 ENCOUNTER — Other Ambulatory Visit: Payer: Self-pay

## 2022-02-23 ENCOUNTER — Ambulatory Visit: Payer: BC Managed Care – PPO | Admitting: Nurse Practitioner

## 2022-02-23 ENCOUNTER — Encounter: Payer: Self-pay | Admitting: Nurse Practitioner

## 2022-02-23 VITALS — BP 100/66 | HR 71 | Ht 65.0 in | Wt 164.0 lb

## 2022-02-23 DIAGNOSIS — G43109 Migraine with aura, not intractable, without status migrainosus: Secondary | ICD-10-CM | POA: Diagnosis not present

## 2022-02-23 DIAGNOSIS — H43399 Other vitreous opacities, unspecified eye: Secondary | ICD-10-CM

## 2022-02-23 DIAGNOSIS — R350 Frequency of micturition: Secondary | ICD-10-CM

## 2022-02-23 DIAGNOSIS — R102 Pelvic and perineal pain: Secondary | ICD-10-CM

## 2022-02-23 MED ORDER — SUMATRIPTAN SUCCINATE 50 MG PO TABS
50.0000 mg | ORAL_TABLET | ORAL | 0 refills | Status: DC | PRN
Start: 2022-02-23 — End: 2022-10-18

## 2022-02-23 NOTE — Progress Notes (Signed)
° °  Alison Mcmillan     MRN: [id number]      DOB: 1996-11-29   HPI Ms. Alison Mcmillan with medical history of ADHD, anxiety and depression, GERD, IBS is here for complaints of bladder infection.  Patient stated that she went to the ED a week ago and was diagnosed with bladder infection treated with antibiotics . pt states that she sometimes does not urinate enough and at other times she has increased urination, drinking a lot of water , not urinating much, feels thirsty. Denies fullness in her bladder. Patient states that her pelvic area is still tender she is currently on her menstrual period.  Patient denies dysuria, fever, chills   Pt c/o chronic HA, has HA at least once or twice a day ,HA localised to front of her head  it makes her eyes hurts and she see dots, has sesitivity to light, sometimes has nausea and with it . She takes Excedrin but it has not been helping,  has has thrbbing HA, she wore prescription glasses in middle school , has not seen a doctor in years.     ROS Denies recent fever or chills. Denies sinus pressure, nasal congestion, ear pain or sore throat. Denies chest congestion, productive cough or wheezing. Denies chest pains, palpitations and leg swelling Denies , nausea, vomiting,diarrhea or constipation.   Denies dysuria, or incontinence has frequency Has  headaches, denies  seizures, numbness, or tingling. Denies depression, anxiety or insomnia. Marland Kitchen   PE  BP 100/66 (BP Location: Right Arm, Patient Position: Sitting, Cuff Size: Normal)    Pulse 71    Ht 5\' 5"  (1.651 m)    Wt 164 lb (74.4 kg)    LMP 02/16/2022 (Approximate)    SpO2 99%    BMI 27.29 kg/m   Patient alert and oriented and in no cardiopulmonary distress.  HEENT: No facial asymmetry, EOMI,     Neck supple .  Chest: Clear to auscultation bilaterally.  CVS: S1, S2 no murmurs, no S3.Regular rate.  ABD: Soft, has pelvic  tenderness.   Psych: Good eye contact, normal affect. Memory intact not anxious or  depressed appearing.  CNS: CN 2-12 intact, power,  normal throughout.no focal deficits noted.   Assessment & Plan

## 2022-02-23 NOTE — Patient Instructions (Addendum)
°  Take Immitrex  1 tablet (50 mg total) by mouth every 2 (two) hours as needed for migraine. May repeat in 2 hours if headache persists or recurs. Do not take more than 2 a day.    It is important that you exercise regularly at least 30 minutes 5 times a week.  Think about what you will eat, plan ahead. Choose " clean, green, fresh or frozen" over canned, processed or packaged foods which are more sugary, salty and fatty. 70 to 75% of food eaten should be vegetables and fruit. Three meals at set times with snacks allowed between meals, but they must be fruit or vegetables. Aim to eat over a 12 hour period , example 7 am to 7 pm, and STOP after  your last meal of the day. Drink water,generally about 64 ounces per day, no other drink is as healthy. Fruit juice is best enjoyed in a healthy way, by EATING the fruit.  Thanks for choosing [facility], we consider it a privelige to serve you.

## 2022-02-24 ENCOUNTER — Encounter: Payer: Self-pay | Admitting: Nurse Practitioner

## 2022-02-24 DIAGNOSIS — R350 Frequency of micturition: Secondary | ICD-10-CM | POA: Insufficient documentation

## 2022-02-24 DIAGNOSIS — G43109 Migraine with aura, not intractable, without status migrainosus: Secondary | ICD-10-CM | POA: Insufficient documentation

## 2022-02-24 NOTE — Assessment & Plan Note (Signed)
Chronic condition Not controlled with Excedrin  Rx Imitrex 50mg . Take 1 tablet (50 mg total) by mouth every 2 (two) hours as needed for migraine. May repeat in 2 hours if headache persists or recurs. Patient told not to take more than 2 doses of Imitrex a day she verbalized understanding.  Patient told to alternate immitrex with  OTC  Tylenol or ibuprofen as needed for her headaches. Referral to ophthalmology for eye exam. Will refill to neurology if symptoms persist

## 2022-02-24 NOTE — Assessment & Plan Note (Signed)
States that she sees floaters before having headaches. States that she wore prescription glasses while in middle school, She has not seen an ophthalmologist in years Vision screening done today, right eye  20/40, left eye 20/40, both eyes 20/30 Patient referred to ophthalmology today.

## 2022-02-24 NOTE — Assessment & Plan Note (Addendum)
Pelvic pain, chronic complaints Currently on her menstrual period. Take ibuprofen as needed for pain. Follow-up with OB/GYN if symptoms persist.

## 2022-02-24 NOTE — Assessment & Plan Note (Signed)
Sometimes has increased urinary frequency, sometimes feels like she is normal urinating enough despite drinking plenty of water Recently seen in the ED for the symptoms UA and culture was negative for UTI Patient symptomatically treated with cefadroxil 500mg  bid. Patient states that she is still taking medication. Has known history of diabetes. Patient told to complete dose of antibiotics and follow-up in 2 months. Will refer to urology if problem persist.

## 2022-04-24 ENCOUNTER — Ambulatory Visit: Payer: BC Managed Care – PPO | Admitting: Nurse Practitioner

## 2022-05-01 ENCOUNTER — Ambulatory Visit: Payer: BC Managed Care – PPO | Admitting: Nurse Practitioner

## 2022-05-01 ENCOUNTER — Encounter: Payer: Self-pay | Admitting: Nurse Practitioner

## 2022-05-01 VITALS — BP 127/76 | HR 82 | Ht 65.0 in | Wt 158.0 lb

## 2022-05-01 DIAGNOSIS — G43109 Migraine with aura, not intractable, without status migrainosus: Secondary | ICD-10-CM | POA: Diagnosis not present

## 2022-05-01 DIAGNOSIS — F419 Anxiety disorder, unspecified: Secondary | ICD-10-CM | POA: Diagnosis not present

## 2022-05-01 DIAGNOSIS — F17200 Nicotine dependence, unspecified, uncomplicated: Secondary | ICD-10-CM | POA: Insufficient documentation

## 2022-05-01 DIAGNOSIS — F32A Depression, unspecified: Secondary | ICD-10-CM | POA: Diagnosis not present

## 2022-05-01 DIAGNOSIS — R1112 Projectile vomiting: Secondary | ICD-10-CM | POA: Diagnosis not present

## 2022-05-01 LAB — POCT URINE PREGNANCY: Preg Test, Ur: NEGATIVE

## 2022-05-01 MED ORDER — ONDANSETRON HCL 4 MG PO TABS: 4.0000 mg | ORAL_TABLET | Freq: Three times a day (TID) | ORAL | 0 refills | Status: DC | PRN

## 2022-05-01 MED ORDER — ESCITALOPRAM OXALATE 10 MG PO TABS: 10.0000 mg | ORAL_TABLET | Freq: Every day | ORAL | 0 refills | Status: DC

## 2022-05-01 NOTE — Assessment & Plan Note (Signed)
Chronic condition well-controlled since she had her recent eye exam. ?Continue Imitrex and OTC Tylenol as needed. ?

## 2022-05-01 NOTE — Progress Notes (Signed)
? ?  Alison Mcmillan     MRN: [id number]      DOB: 1996/02/07 ? ? ?HPI ?Alison Mcmillan with past medical history of anxiety and depression, asthma, migraine with aura and without status migrainous is here for follow up for migraine and anxiety and depression ?Follw up fro migraine.. ? ?Vomiting. patient states that for the past 2 weeks she has been having projectile vomiting in the morning, she took pregnancy test and they were all negative. Vomits yellow colored liquid. and sometimes have abdominal pain.denies fever, chills, bloddy stool, diarrhea, constipation she took some motion sickness med and it did not help. She is taking otc prevacid  which helps her abdominal pain ? ?Migaine . Not seeing dots any more and has been having less frequent HA since she got her new precription glasses.  Denies numbness, tingling, seizure ? ?Depression and anxiety. She has stopped taking lexapro 5mg  due to not been able to afford medication at a time, med last taken about a month ago, denies SI, HI.  States that she did not really feel any difference in her condition while taking Lexapro 5 mg daily.  ? ? ? ?ROS ?Denies recent fever or chills. ?Denies sinus pressure, nasal congestion, ear pain or sore throat. ?Denies chest congestion, productive cough or wheezing. ?Denies chest pains, palpitations and leg swelling   ?Denies dysuria, frequency, hesitancy or incontinence. ?Denies joint pain, swelling and limitation in mobility. ? ? ? ? ?PE ? ?BP 127/76 (BP Location: Right Arm, Patient Position: Sitting, Cuff Size: Large)   Pulse 82   Ht 5\' 5"  (1.651 m)   Wt 158 lb (71.7 kg)   LMP 04/16/2022 (Approximate)   SpO2 99%   BMI 26.29 kg/m?  ? ?Patient alert and oriented and in no cardiopulmonary distress. ? ?Chest: Clear to auscultation bilaterally. ? ?CVS: S1, S2 no murmurs, no S3.Regular rate. ? ?ABD: Soft non tender.  ? ?Ext: No edema ? ?MS: Adequate ROM spine, shoulders, hips and knees. ? ?Skin: Intact, no ulcerations or rash  noted. ? ?Psych: Good eye contact, normal affect. Memory intact not anxious or depressed appearing. ? ?CNS: CN 2-12 intact, power,  normal throughout.no focal deficits noted. ? ? ?Assessment & Plan ?Current every day smoker ?A pack of cigarettes last her a week, vapes everyday , she has tried to quit in the past.  Need to quit smoking and vaping due to risk of lung cancer, COPD and other respiratory diseases discussed with patient.  Patient willing to start OTC nicotine patch for smoking cessation. ?Follow-up in 4 months.  ? ?Projectile vomiting with nausea ?Acute symptoms, no concern for infection ?Negative pregnancy test today ?Take Zofran 4 mg every 8 hours as needed nausea vomiting, eat crackers drink ginger ale as needed ?Drink at least 64 ounces of water daily to prevent dehydration ?Will refer to GI if symptoms persist. ? ?Anxiety and depression ?Has been out of Lexapro for about a month ?Start Lexapro 10 mg daily ?Denies SI, HI ?PHQ-9 score 0, GAD 7 score 11 ?Condition much improved based on PHQ-9 and GAD-7 scoring today.  ?Encouraged to continue therapy at work ?Need to take medication daily and not stopping abruptly discussed with patient she verbalized understanding. ? ? ?Migraine with aura and without status migrainosus, not intractable ?Chronic condition well-controlled since she had her recent eye exam. ?Continue Imitrex and OTC Tylenol as needed.  ? ?

## 2022-05-01 NOTE — Assessment & Plan Note (Addendum)
Has been out of Lexapro for about a month ?Start Lexapro 10 mg daily ?Denies SI, HI ?PHQ-9 score 0, GAD 7 score 11 ?Condition much improved based on PHQ-9 and GAD-7 scoring today.  ?Encouraged to continue therapy at work ?Need to take medication daily and not stopping abruptly discussed with patient she verbalized understanding. ? ?

## 2022-05-01 NOTE — Assessment & Plan Note (Addendum)
A pack of cigarettes last her a week, vapes everyday , she has tried to quit in the past.  Need to quit smoking and vaping due to risk of lung cancer, COPD and other respiratory diseases discussed with patient.  Patient willing to start OTC nicotine patch for smoking cessation. ?Follow-up in 4 months.  ?

## 2022-05-01 NOTE — Patient Instructions (Addendum)
Please start taking lexapro 10mg  daily. ?Please start taking Nicotine patch as discussed  ?Please take Zofran 4 mg every 8 hours as needed for nausea and vomiting ? ?It is important that you exercise regularly at least 30 minutes 5 times a week.  ?Think about what you will eat, plan ahead. ?Choose " clean, green, fresh or frozen" over canned, processed or packaged foods which are more sugary, salty and fatty. ?70 to 75% of food eaten should be vegetables and fruit. ?Three meals at set times with snacks allowed between meals, but they must be fruit or vegetables. ?Aim to eat over a 12 hour period , example 7 am to 7 pm, and STOP after  your last meal of the day. ?Drink water,generally about 64 ounces per day, no other drink is as healthy. Fruit juice is best enjoyed in a healthy way, by EATING the fruit. ? ?Thanks for choosing [facility], we consider it a privelige to serve you.  ?

## 2022-05-01 NOTE — Assessment & Plan Note (Addendum)
Acute symptoms, no concern for infection ?Negative pregnancy test today ?Take Zofran 4 mg every 8 hours as needed nausea vomiting, eat crackers drink ginger ale as needed ?Drink at least 64 ounces of water daily to prevent dehydration ?Will refer to GI if symptoms persist. ?

## 2022-06-05 ENCOUNTER — Encounter: Payer: BC Managed Care – PPO | Admitting: Nurse Practitioner

## 2022-07-15 IMAGING — CT CT ABD-PELV W/O CM
2 of 4 series · 16 of 46 positions shown, 18 images · non-contrast
Comparison: None.

CLINICAL DATA: Acute epigastric abdominal pain.

EXAM:
CT ABDOMEN AND PELVIS WITHOUT CONTRAST
TECHNIQUE: Multidetector CT imaging of the abdomen and pelvis was performed
following the standard protocol without IV contrast.

[Series 2: axial st · axial · 0.61mm/px · z∈[+1116,+1531]mm · 13 of 95 slices shown, 15 images]
[im 6/95  soft-tissue]
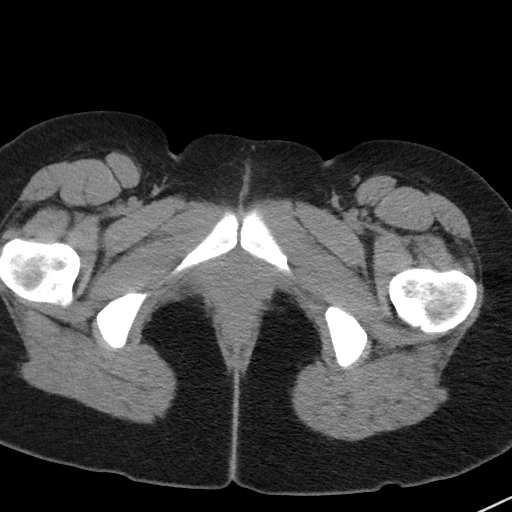
[im 6/95  bone]
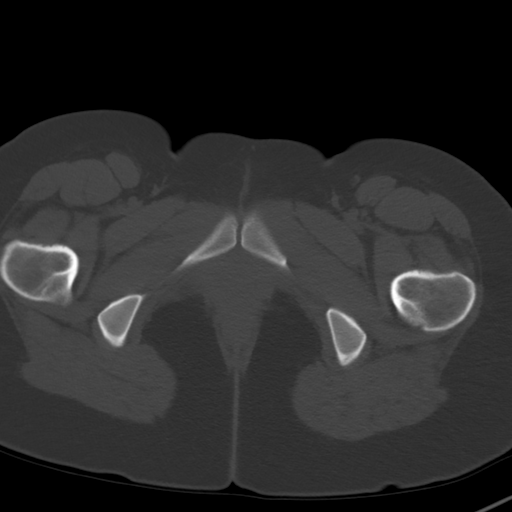
[im 12/95  soft-tissue]
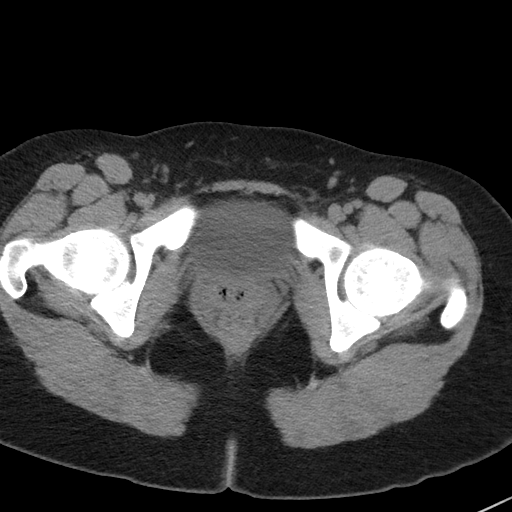
[im 23/95  soft-tissue]
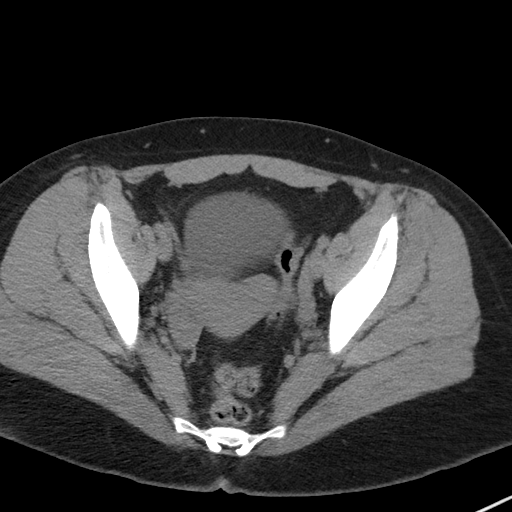
[im 28/95  soft-tissue]
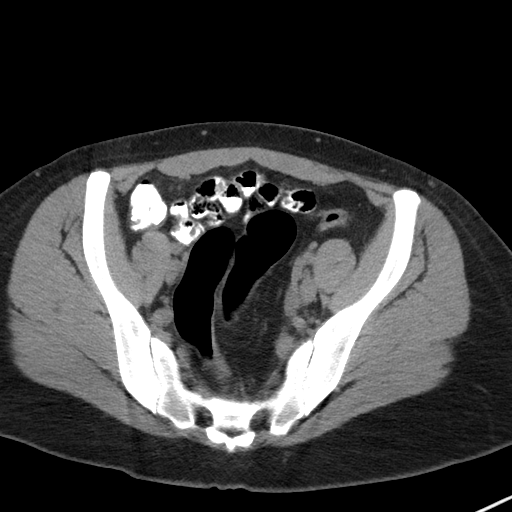
[im 34/95  soft-tissue]
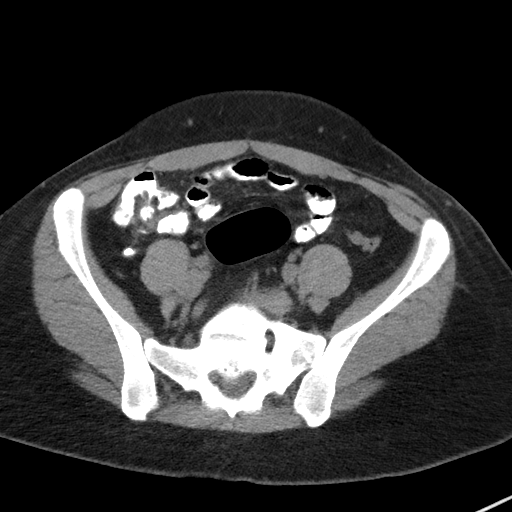
[im 39/95  soft-tissue]
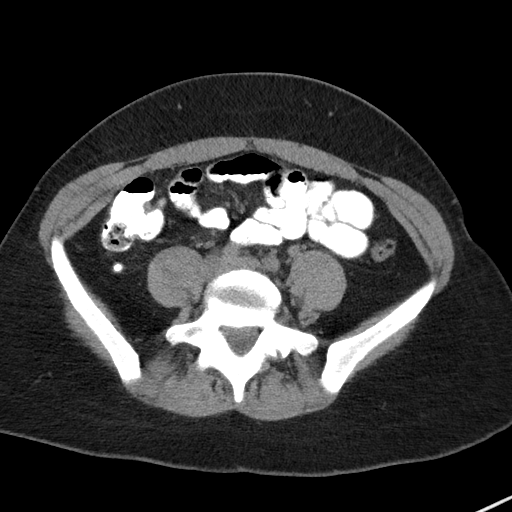
[im 50/95  soft-tissue]
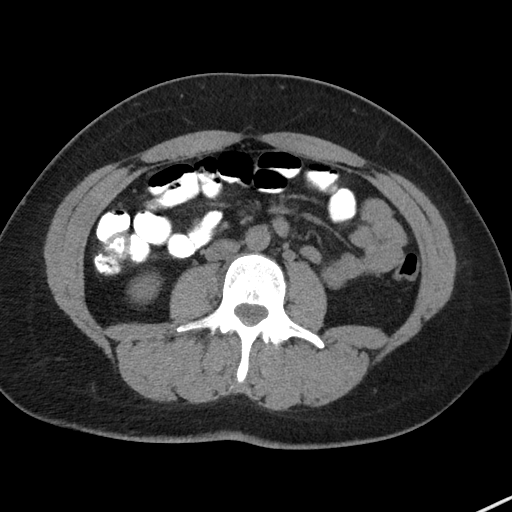
[im 56/95  soft-tissue]
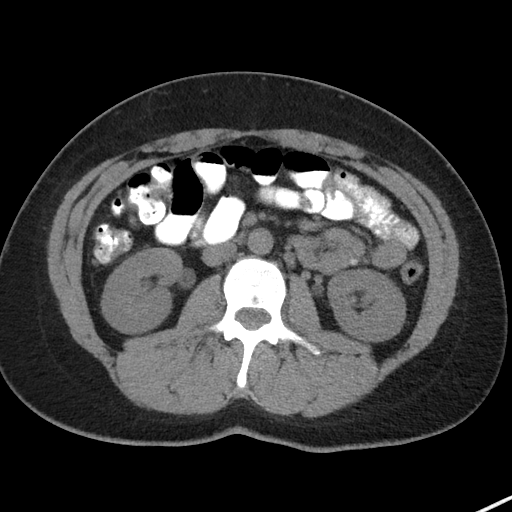
[im 61/95  soft-tissue]
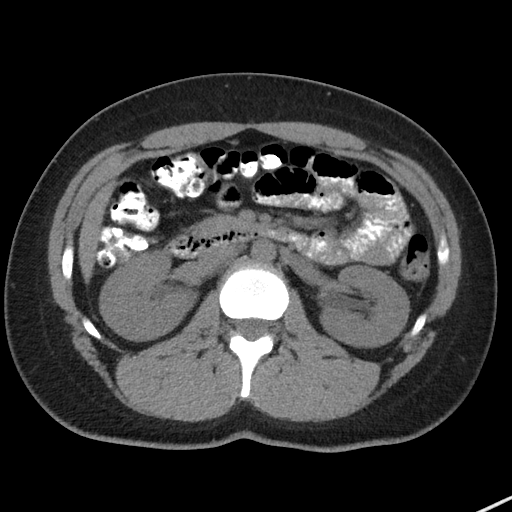
[im 61/95  bone]
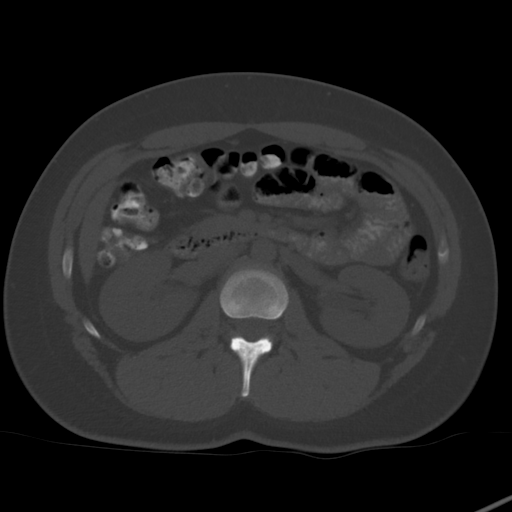
[im 67/95  soft-tissue]
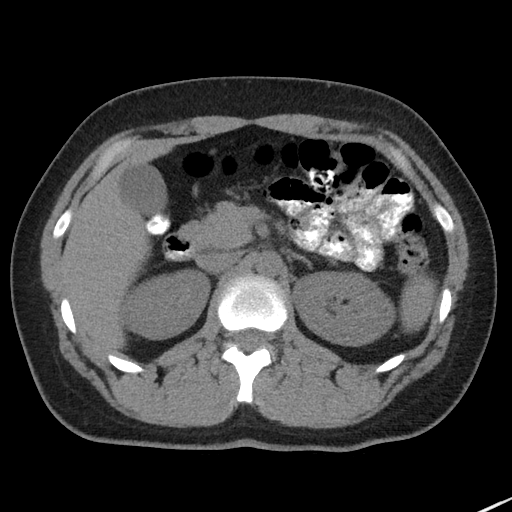
[im 72/95  soft-tissue]
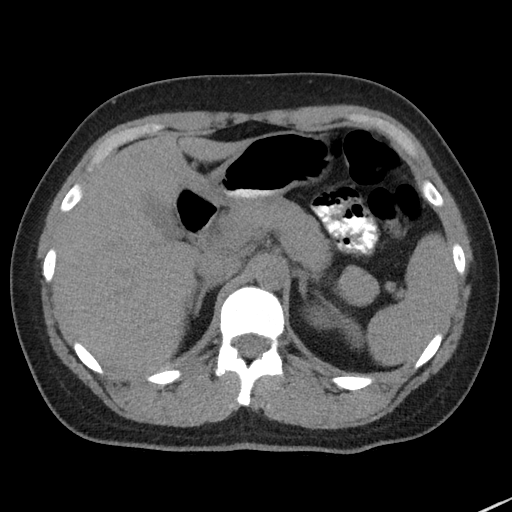
[im 83/95  soft-tissue]
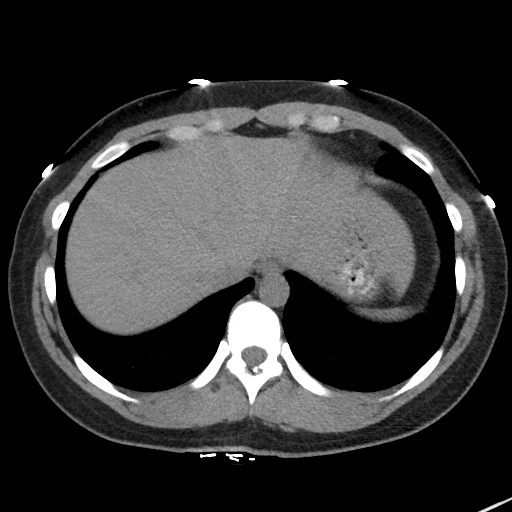
[im 89/95  soft-tissue]
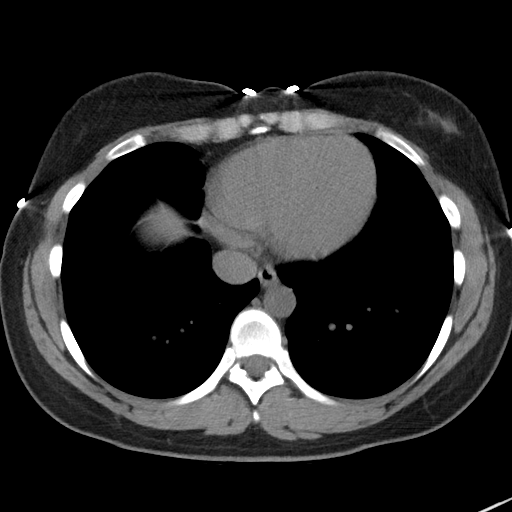

[Series 5: coronal st · coronal · 0.66mm/px · 3 of 74 slices shown]
[im 25/74  soft-tissue]
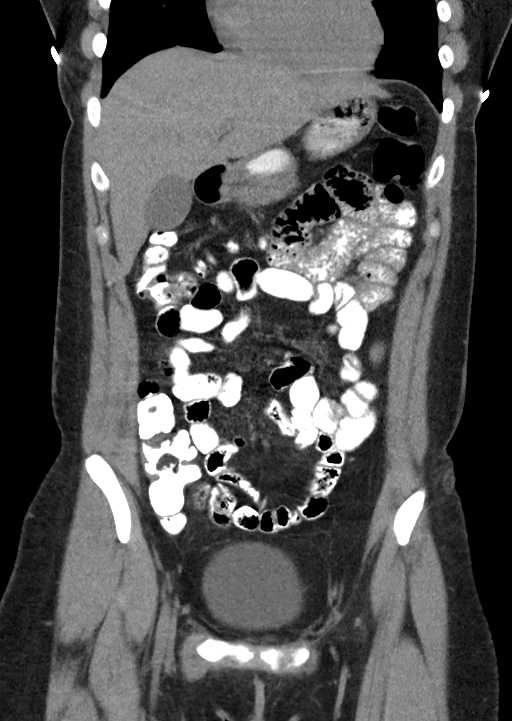
[im 33/74  soft-tissue]
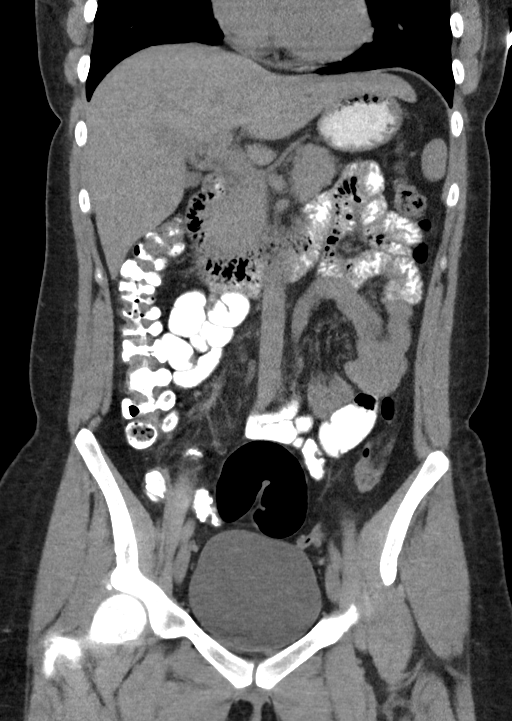
[im 41/74  soft-tissue]
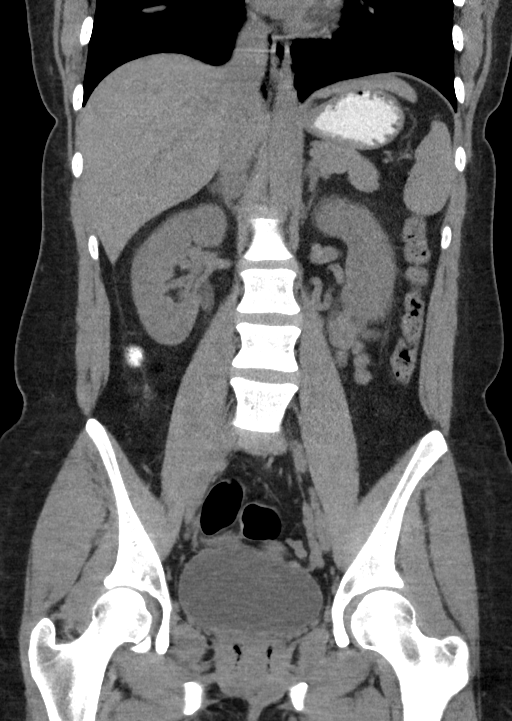

[16 of 46 positions shown; findings below may reference images not displayed]

FINDINGS: Lower chest: No acute abnormality.

Hepatobiliary: No focal liver abnormality is seen. No gallstones,
gallbladder wall thickening, or biliary dilatation.

Pancreas: Unremarkable. No pancreatic ductal dilatation or
surrounding inflammatory changes.

Spleen: Normal in size without focal abnormality.

Adrenals/Urinary Tract: Adrenal glands are unremarkable. Kidneys are
normal, without renal calculi, focal lesion, or hydronephrosis.
Bladder is unremarkable.

Stomach/Bowel: Stomach is within normal limits. Appendix appears
normal. No evidence of bowel wall thickening, distention, or
inflammatory changes.

Vascular/Lymphatic: No significant vascular findings are present. No
enlarged abdominal or pelvic lymph nodes.

Reproductive: Uterus and bilateral adnexa are unremarkable.

Other: No abdominal wall hernia or abnormality. No abdominopelvic
ascites.

Musculoskeletal: No acute or significant osseous findings.
IMPRESSION: No abnormality seen in the abdomen or pelvis.

## 2022-08-16 IMAGING — DX DG CHEST 1V PORT
1 series · 1 of 1 positions shown · non-contrast
Comparison: None.

CLINICAL DATA: Cough.

EXAM:
PORTABLE CHEST 1 VIEW

[chest ap]
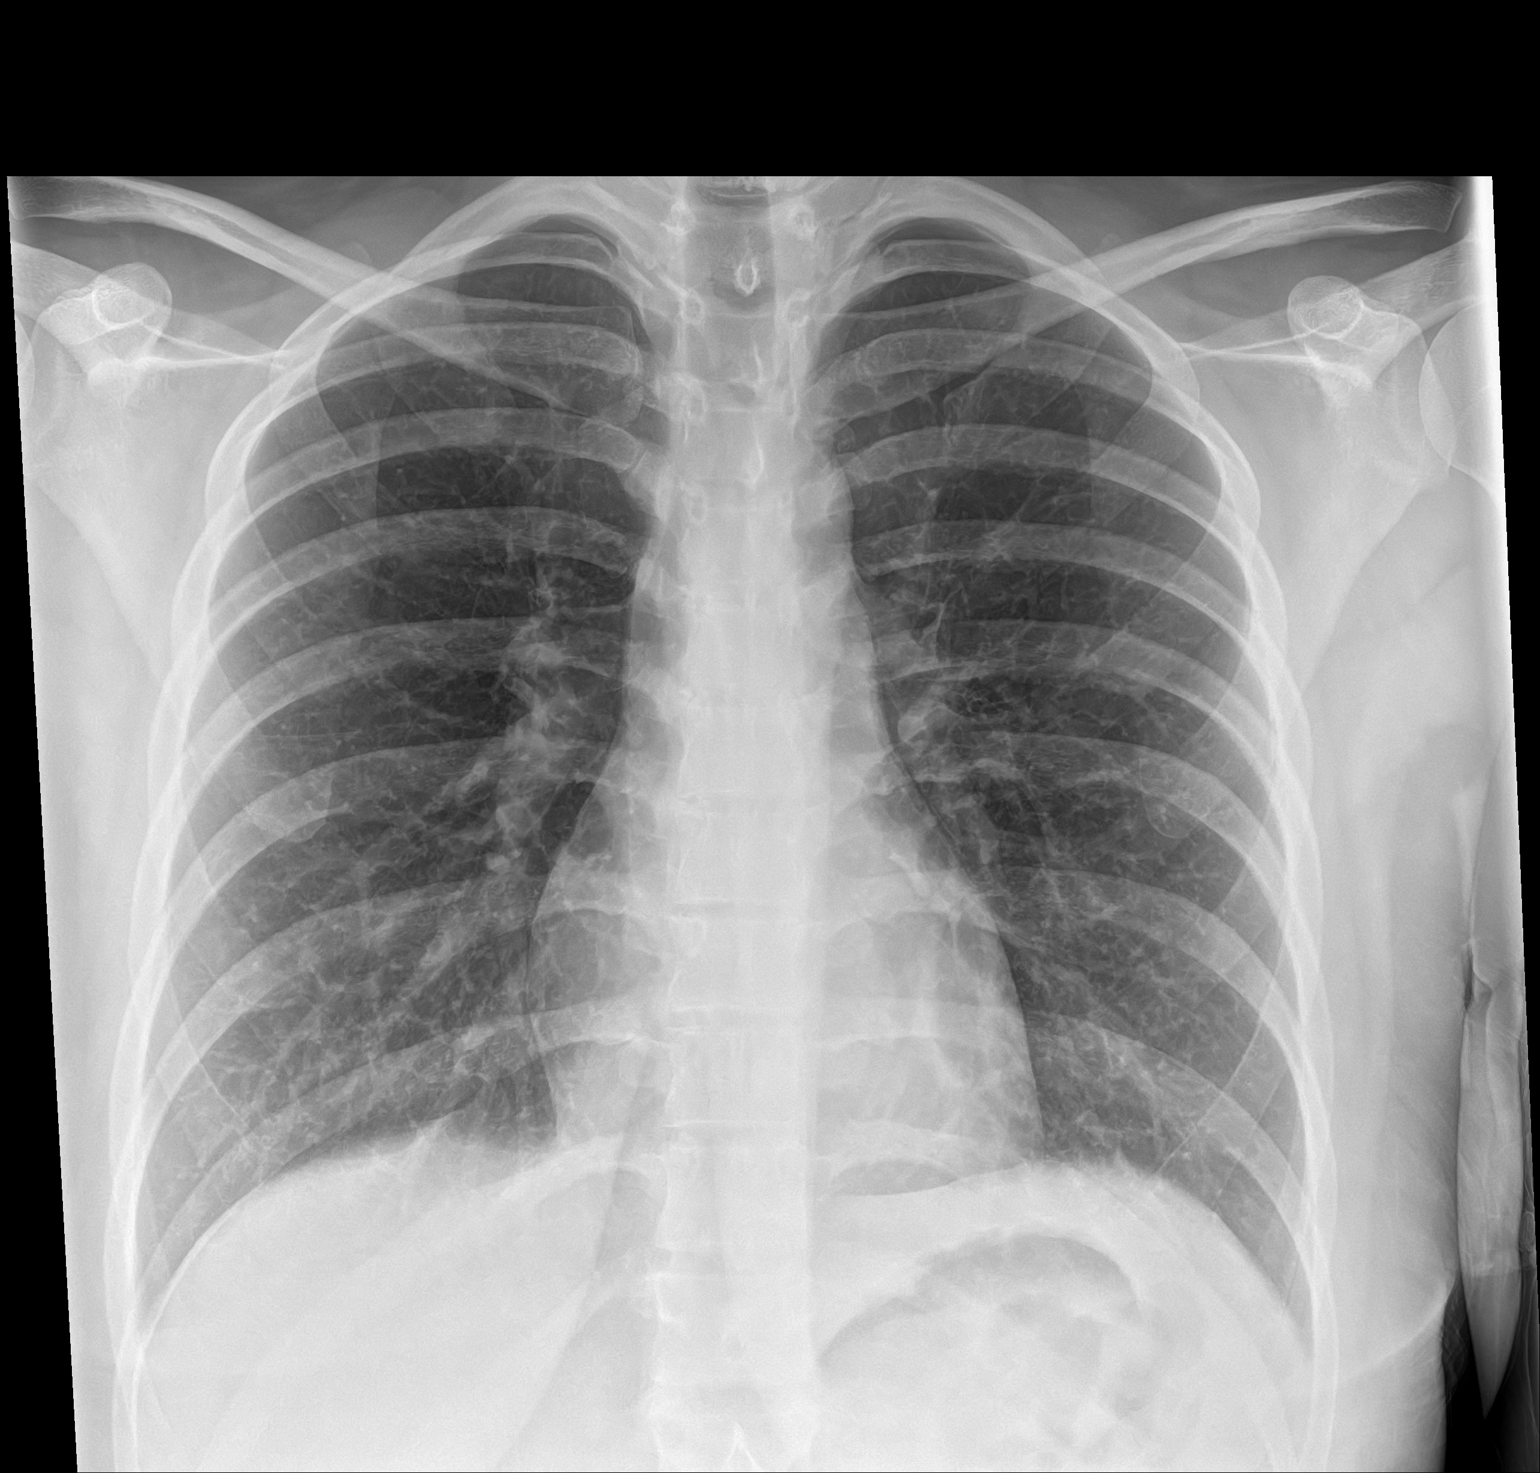

[1 of 1 positions shown; findings below may reference images not displayed]

FINDINGS: The heart size and mediastinal contours are within normal limits.
Both lungs are clear. The visualized skeletal structures are
unremarkable.
IMPRESSION: No active disease.

## 2022-08-31 ENCOUNTER — Encounter: Payer: BC Managed Care – PPO | Admitting: Nurse Practitioner

## 2022-09-14 ENCOUNTER — Encounter: Payer: Self-pay | Admitting: Nurse Practitioner

## 2022-09-14 ENCOUNTER — Encounter: Payer: BC Managed Care – PPO | Admitting: Nurse Practitioner

## 2022-10-18 ENCOUNTER — Ambulatory Visit (HOSPITAL_COMMUNITY)
Admission: RE | Admit: 2022-10-18 | Discharge: 2022-10-18 | Disposition: A | Discharge status: Home / Self Care | Payer: 59 | Source: Ambulatory Visit | Attending: Family Medicine | Admitting: Family Medicine

## 2022-10-18 ENCOUNTER — Ambulatory Visit (INDEPENDENT_AMBULATORY_CARE_PROVIDER_SITE_OTHER): Payer: 59 | Admitting: Family Medicine

## 2022-10-18 DIAGNOSIS — F419 Anxiety disorder, unspecified: Secondary | ICD-10-CM

## 2022-10-18 DIAGNOSIS — R059 Cough, unspecified: Secondary | ICD-10-CM | POA: Diagnosis not present

## 2022-10-18 DIAGNOSIS — R052 Subacute cough: Secondary | ICD-10-CM | POA: Insufficient documentation

## 2022-10-18 DIAGNOSIS — R69 Illness, unspecified: Secondary | ICD-10-CM | POA: Diagnosis not present

## 2022-10-18 DIAGNOSIS — K219 Gastro-esophageal reflux disease without esophagitis: Secondary | ICD-10-CM | POA: Diagnosis not present

## 2022-10-18 DIAGNOSIS — F32A Depression, unspecified: Secondary | ICD-10-CM | POA: Diagnosis not present

## 2022-10-18 MED ORDER — PANTOPRAZOLE SODIUM 40 MG PO TBEC: 40.0000 mg | DELAYED_RELEASE_TABLET | Freq: Two times a day (BID) | ORAL | 3 refills | Status: DC

## 2022-10-18 NOTE — Patient Instructions (Signed)
Chest x-ray in the hospital.  Medication as prescribed.  Try [facility].  Follow up in 3 months or sooner if needed.

## 2022-10-19 DIAGNOSIS — R052 Subacute cough: Secondary | ICD-10-CM | POA: Insufficient documentation

## 2022-10-19 NOTE — Assessment & Plan Note (Signed)
Patient experiencing worsening GERD.  May have a component of gastritis as well.  Protonix 40 mg twice daily.

## 2022-10-19 NOTE — Progress Notes (Signed)
Subjective:  Patient ID: Alison Mcmillan, female    DOB: 1996-04-05  Age: 26 y.o. MRN: [id number]  CC: Chief Complaint  Patient presents with   Establish Care    Reflux , nausea, and vomiting more often not currently on any meds     HPI:  26 year old female \\with  a history of GERD and irritable bowel syndrome presents for evaluation of the above.  Patient reports that she has had ongoing issues with her stomach for the past couple of months.  She states that she has frequent nausea and vomiting.  She has previously used over-the-counter Prilosec as well as Pepcid without resolution.  Reports associated upper abdominal pain.  She vapes.  No alcohol.  Patient also reports that she continues to have difficulty with and depression.  She states that it has been worsening over the past year.  She is interested in seeing a counselor.  Additionally, patient has had an ongoing cough.  She has had this for quite a while after getting a respiratory infection.  No current fever.  Patient Active Problem List   Diagnosis Date Noted   Subacute cough 10/19/2022   Migraine with aura and without status migrainosus, not intractable 02/24/2022   Anxiety and depression 06/02/2021   Irritable bowel syndrome with diarrhea 05/21/2016   Allergic rhinitis 11/18/2013   Asthma 11/04/2013   Gastroesophageal reflux     Social Hx   Social History   Socioeconomic History   Marital status: Single    Spouse name: Not on file   Number of children: Not on file   Years of education: Not on file   Highest education level: Not on file  Occupational History   Occupation: [employer]    Comment: Engineer, maintenance (IT)  Tobacco Use   Smoking status: Some Days    Packs/day: 0.25    Years: 2.00    Total pack years: 0.50    Types: Cigarettes   Smokeless tobacco: Never  Vaping Use   Vaping Use: Some days   Substances: Nicotine, Flavoring  Substance and Sexual Activity   Alcohol use: Not Currently    Comment: monthly    Drug use: No   Sexual activity: Not Currently    Birth control/protection: Condom, Injection  Other Topics Concern   Not on file  Social History Narrative   9th grade-homeschooled   Social Determinants of Health   Financial Resource Strain: Low Risk  (06/02/2021)   Overall Financial Resource Strain (CARDIA)    Difficulty of Paying Living Expenses: Not very hard  Food Insecurity: No Food Insecurity (06/02/2021)   Hunger Vital Sign    Worried About Running Out of Food in the Last Year: Never true    Ran Out of Food in the Last Year: Never true  Transportation Needs: No Transportation Needs (06/02/2021)   PRAPARE - Hydrologist (Medical): No    Lack of Transportation (Non-Medical): No  Physical Activity: Inactive (06/02/2021)   Exercise Vital Sign    Days of Exercise per Week: 0 days    Minutes of Exercise per Session: 0 min  Stress: Stress Concern Present (06/02/2021)   [place]    Feeling of Stress : Very much  Social Connections: Socially Isolated (06/02/2021)   Social Connection and Isolation Panel [NHANES]    Frequency of Communication with Friends and Family: Twice a week    Frequency of Social Gatherings with Friends and Family: Once a week  Attends Religious Services: Never    Active Member of Clubs or Organizations: No    Attends Banker Meetings: Never    Marital Status: Never married    Review of Systems Per HPI  Objective:  BP 128/80   Pulse 61   Temp 98.1 F (36.7 C)   Ht 5\' 5"  (1.651 m)   Wt 159 lb (72.1 kg)   LMP 10/14/2022 (Exact Date)   SpO2 99%   BMI 26.46 kg/m      10/18/2022    3:10 PM 05/01/2022    4:11 PM 02/23/2022    2:37 PM  BP/Weight  Systolic BP 128 127 100  Diastolic BP 80 76 66  Wt. (Lbs) 159 158 164  BMI 26.46 kg/m2 26.29 kg/m2 27.29 kg/m2    Physical Exam Vitals and nursing note reviewed.  Constitutional:      General: She is  not in acute distress.    Appearance: Normal appearance.  HENT:     Head: Normocephalic and atraumatic.     Nose: Nose normal.     Mouth/Throat:     Pharynx: Oropharynx is clear.  Eyes:     General:        Right eye: No discharge.        Left eye: No discharge.     Conjunctiva/sclera: Conjunctivae normal.  Cardiovascular:     Rate and Rhythm: Normal rate and regular rhythm.  Pulmonary:     Effort: Pulmonary effort is normal.     Breath sounds: Wheezing present.  Abdominal:     General: There is no distension.     Palpations: Abdomen is soft.     Comments: Epigastric tenderness to palpation.  Neurological:     Mental Status: She is alert.  Psychiatric:        Mood and Affect: Mood normal.        Behavior: Behavior normal.     Lab Results  Component Value Date   WBC 6.6 02/15/2022   HGB 12.5 02/15/2022   HCT 36.3 02/15/2022   PLT 242 02/15/2022   GLUCOSE 98 02/15/2022   CHOL 139 03/23/2021   TRIG 60 03/23/2021   HDL 37 (L) 03/23/2021   LDLCALC 89 03/23/2021   ALT 17 02/15/2022   AST 20 02/15/2022   NA 137 02/15/2022   K 3.6 02/15/2022   CL 107 02/15/2022   CREATININE 0.68 02/15/2022   BUN 14 02/15/2022   CO2 19 (L) 02/15/2022   HGBA1C 5.8 (H) 01/07/2013     Assessment & Plan:   Problem List Items Addressed This Visit       Digestive   Gastroesophageal reflux    Patient experiencing worsening GERD.  May have a component of gastritis as well.  Protonix 40 mg twice daily.      Relevant Medications   pantoprazole (PROTONIX) 40 MG tablet     Other   Subacute cough    Chest x-ray was obtained and was independently reviewed by me.  Impression: Normal chest x-ray.  No evidence of pneumonia. Supportive care.       Relevant Orders   DG Chest 2 View (Completed)   Anxiety and depression    Information given regarding [facility].       Meds ordered this encounter  Medications   pantoprazole (PROTONIX) 40 MG tablet    Sig: Take 1 tablet (40 mg  total) by mouth 2 (two) times daily before a meal.    Dispense:  60 tablet  Refill:  3    Follow-up:  Return in about 3 months (around 01/18/2023).  Everlene Other DO [facility]

## 2022-10-19 NOTE — Assessment & Plan Note (Signed)
Chest x-ray was obtained and was independently reviewed by me.  Impression: Normal chest x-ray.  No evidence of pneumonia. Supportive care.

## 2022-10-19 NOTE — Assessment & Plan Note (Signed)
Information given regarding [facility].  

## 2022-11-07 ENCOUNTER — Ambulatory Visit (INDEPENDENT_AMBULATORY_CARE_PROVIDER_SITE_OTHER): Payer: 59 | Admitting: Family Medicine

## 2022-11-07 VITALS — BP 119/80 | Temp 98.4°F | Ht 65.0 in | Wt 161.4 lb

## 2022-11-07 DIAGNOSIS — J40 Bronchitis, not specified as acute or chronic: Secondary | ICD-10-CM | POA: Insufficient documentation

## 2022-11-07 MED ORDER — DOXYCYCLINE HYCLATE 100 MG PO TABS: 100.0000 mg | ORAL_TABLET | Freq: Two times a day (BID) | ORAL | 0 refills | Status: DC

## 2022-11-07 MED ORDER — PROMETHAZINE-DM 6.25-15 MG/5ML PO SYRP: 5.0000 mL | ORAL_SOLUTION | Freq: Four times a day (QID) | ORAL | 0 refills | Status: DC | PRN

## 2022-11-07 NOTE — Assessment & Plan Note (Signed)
Treating with Doxycycline and Promethazine DM.

## 2022-11-07 NOTE — Progress Notes (Signed)
Subjective:  Patient ID: Alison Mcmillan, female    DOB: 1996-10-29  Age: 26 y.o. MRN: [id number]  CC: Chief Complaint  Patient presents with   cough and congestion     Nasal and chest x 2 weeks     HPI:  26 year old female presents with persistent cough.   Patient recently seen on 10/19. Chest xray at that time was clear. Cough continues to persist. Cough is productive. Some SOB. No fever. No relieving factors. No other complaints at this time.  Patient Active Problem List   Diagnosis Date Noted   Bronchitis 11/07/2022   Migraine with aura and without status migrainosus, not intractable 02/24/2022   Anxiety and depression 06/02/2021   Irritable bowel syndrome with diarrhea 05/21/2016   Allergic rhinitis 11/18/2013   Asthma 11/04/2013   Gastroesophageal reflux     Social Hx   Social History   Socioeconomic History   Marital status: Single    Spouse name: Not on file   Number of children: Not on file   Years of education: Not on file   Highest education level: Not on file  Occupational History   Occupation: [employer]    Comment: Public librarian  Tobacco Use   Smoking status: Some Days    Packs/day: 0.25    Years: 2.00    Total pack years: 0.50    Types: Cigarettes   Smokeless tobacco: Never  Vaping Use   Vaping Use: Some days   Substances: Nicotine, Flavoring  Substance and Sexual Activity   Alcohol use: Not Currently    Comment: monthly   Drug use: No   Sexual activity: Not Currently    Birth control/protection: Condom, Injection  Other Topics Concern   Not on file  Social History Narrative   9th grade-homeschooled   Social Determinants of Health   Financial Resource Strain: Low Risk  (06/02/2021)   Overall Financial Resource Strain (CARDIA)    Difficulty of Paying Living Expenses: Not very hard  Food Insecurity: No Food Insecurity (06/02/2021)   Hunger Vital Sign    Worried About Running Out of Food in the Last Year: Never true    Ran Out of Food in the  Last Year: Never true  Transportation Needs: No Transportation Needs (06/02/2021)   PRAPARE - Administrator, Civil Service (Medical): No    Lack of Transportation (Non-Medical): No  Physical Activity: Inactive (06/02/2021)   Exercise Vital Sign    Days of Exercise per Week: 0 days    Minutes of Exercise per Session: 0 min  Stress: Stress Concern Present (06/02/2021)   [employer] of Occupational Health - Occupational Stress Questionnaire    Feeling of Stress : Very much  Social Connections: Socially Isolated (06/02/2021)   Social Connection and Isolation Panel [NHANES]    Frequency of Communication with Friends and Family: Twice a week    Frequency of Social Gatherings with Friends and Family: Once a week    Attends Religious Services: Never    Database administrator or Organizations: No    Attends Engineer, structural: Never    Marital Status: Never married    Review of Systems Per HPI  Objective:  BP 119/80   Temp 98.4 F (36.9 C) (Oral)   Ht 5\' 5"  (1.651 m)   Wt 161 lb 6.4 oz (73.2 kg)   LMP 10/14/2022 (Exact Date)   BMI 26.86 kg/m      11/07/2022   11:03 AM 10/18/2022  3:10 PM 05/01/2022    4:11 PM  BP/Weight  Systolic BP 119 128 127  Diastolic BP 80 80 76  Wt. (Lbs) 161.4 159 158  BMI 26.86 kg/m2 26.46 kg/m2 26.29 kg/m2    Physical Exam  Lab Results  Component Value Date   WBC 6.6 02/15/2022   HGB 12.5 02/15/2022   HCT 36.3 02/15/2022   PLT 242 02/15/2022   GLUCOSE 98 02/15/2022   CHOL 139 03/23/2021   TRIG 60 03/23/2021   HDL 37 (L) 03/23/2021   LDLCALC 89 03/23/2021   ALT 17 02/15/2022   AST 20 02/15/2022   NA 137 02/15/2022   K 3.6 02/15/2022   CL 107 02/15/2022   CREATININE 0.68 02/15/2022   BUN 14 02/15/2022   CO2 19 (L) 02/15/2022   HGBA1C 5.8 (H) 01/07/2013     Assessment & Plan:   Problem List Items Addressed This Visit       Respiratory   Bronchitis - Primary    Treating with Doxycycline and Promethazine  DM.       Meds ordered this encounter  Medications   doxycycline (VIBRA-TABS) 100 MG tablet    Sig: Take 1 tablet (100 mg total) by mouth 2 (two) times daily.    Dispense:  14 tablet    Refill:  0   promethazine-dextromethorphan (PROMETHAZINE-DM) 6.25-15 MG/5ML syrup    Sig: Take 5 mLs by mouth 4 (four) times daily as needed for cough.    Dispense:  118 mL    Refill:  0    Follow-up:  Return if symptoms worsen or fail to improve.  Everlene Other DO [facility]

## 2023-01-18 ENCOUNTER — Ambulatory Visit (INDEPENDENT_AMBULATORY_CARE_PROVIDER_SITE_OTHER): Payer: 59 | Admitting: Family Medicine

## 2023-01-18 ENCOUNTER — Encounter: Payer: Self-pay | Admitting: Family Medicine

## 2023-01-18 DIAGNOSIS — F419 Anxiety disorder, unspecified: Secondary | ICD-10-CM | POA: Diagnosis not present

## 2023-01-18 DIAGNOSIS — J3081 Allergic rhinitis due to animal (cat) (dog) hair and dander: Secondary | ICD-10-CM

## 2023-01-18 DIAGNOSIS — K580 Irritable bowel syndrome with diarrhea: Secondary | ICD-10-CM | POA: Diagnosis not present

## 2023-01-18 DIAGNOSIS — R69 Illness, unspecified: Secondary | ICD-10-CM | POA: Diagnosis not present

## 2023-01-18 DIAGNOSIS — F32A Depression, unspecified: Secondary | ICD-10-CM

## 2023-01-18 MED ORDER — ONDANSETRON 4 MG PO TBDP: 4.0000 mg | ORAL_TABLET | Freq: Three times a day (TID) | ORAL | 0 refills | Status: DC | PRN

## 2023-01-18 MED ORDER — DULOXETINE HCL 60 MG PO CPEP: 60.0000 mg | ORAL_CAPSULE | Freq: Every day | ORAL | 3 refills | Status: AC

## 2023-01-18 MED ORDER — LEVOCETIRIZINE DIHYDROCHLORIDE 5 MG PO TABS: 5.0000 mg | ORAL_TABLET | Freq: Every evening | ORAL | 1 refills | Status: AC

## 2023-01-18 NOTE — Patient Instructions (Signed)
Medications as prescribed.  Follow up in 6 weeks regarding depression and anxiety.

## 2023-01-20 DIAGNOSIS — J3081 Allergic rhinitis due to animal (cat) (dog) hair and dander: Secondary | ICD-10-CM | POA: Insufficient documentation

## 2023-01-20 NOTE — Assessment & Plan Note (Signed)
Uncontrolled.  Starting Cymbalta.

## 2023-01-20 NOTE — Assessment & Plan Note (Signed)
Improved but still having intermittent vomiting.  I believe that her anxiety is playing a role as well.  Zofran as needed.

## 2023-01-20 NOTE — Progress Notes (Signed)
Subjective:  Patient ID: Alison Mcmillan, female    DOB: 21-Dec-1996  Age: 27 y.o. MRN: [id number]  CC: Chief Complaint  Patient presents with   Follow-up    Congestion; nasal pressure, chest pressure-cat allergy (recently got 3rd cat because up until now it has been bearable)  Stomach issues have improved but still having some issues    HPI:  27 year old female presents for follow-up.  GI symptoms have improved.  However, she continues to have intermittent vomiting.  She uses marijuana on a regular basis but states that this helps her symptoms as opposed to worsening her symptoms.  Patient reports recent issues with congestion.  Patient states that she has a cat allergy and recently got a third cat.  She has previously been able to manage her symptoms but she is currently having lots of difficulty.  No current meds at this time.  Patient also reports that anxiety and depression seem to be worsening.  PHQ-9 score 13.  GAD-7 score of 11.  She has been on medication previously and would like to discuss starting something again.  She has been on Prozac, Celexa, and Lexapro previously.  Patient Active Problem List   Diagnosis Date Noted   Allergy to cats 01/20/2023   Migraine with aura and without status migrainosus, not intractable 02/24/2022   Anxiety and depression 06/02/2021   Irritable bowel syndrome with diarrhea 05/21/2016   Asthma 11/04/2013   Gastroesophageal reflux     Social Hx   Social History   Socioeconomic History   Marital status: Single    Spouse name: Not on file   Number of children: Not on file   Years of education: Not on file   Highest education level: Not on file  Occupational History   Occupation: [employer]    Comment: Engineer, maintenance (IT)  Tobacco Use   Smoking status: Some Days    Packs/day: 0.25    Years: 2.00    Total pack years: 0.50    Types: Cigarettes   Smokeless tobacco: Never  Vaping Use   Vaping Use: Some days   Substances: Nicotine, Flavoring   Substance and Sexual Activity   Alcohol use: Not Currently    Comment: monthly   Drug use: No   Sexual activity: Not Currently    Birth control/protection: Condom, Injection  Other Topics Concern   Not on file  Social History Narrative   9th grade-homeschooled   Social Determinants of Health   Financial Resource Strain: Low Risk  (06/02/2021)   Overall Financial Resource Strain (CARDIA)    Difficulty of Paying Living Expenses: Not very hard  Food Insecurity: No Food Insecurity (06/02/2021)   Hunger Vital Sign    Worried About Running Out of Food in the Last Year: Never true    Ran Out of Food in the Last Year: Never true  Transportation Needs: No Transportation Needs (06/02/2021)   PRAPARE - Hydrologist (Medical): No    Lack of Transportation (Non-Medical): No  Physical Activity: Inactive (06/02/2021)   Exercise Vital Sign    Days of Exercise per Week: 0 days    Minutes of Exercise per Session: 0 min  Stress: Stress Concern Present (06/02/2021)   [place]    Feeling of Stress : Very much  Social Connections: Socially Isolated (06/02/2021)   Social Connection and Isolation Panel [NHANES]    Frequency of Communication with Friends and Family: Twice a week  Frequency of Social Gatherings with Friends and Family: Once a week    Attends Religious Services: Never    Marine scientist or Organizations: No    Attends Music therapist: Never    Marital Status: Never married    Review of Systems Per HPI  Objective:  BP 119/75   Pulse 74   Temp 98.6 F (37 C)   Wt 160 lb 6.4 oz (72.8 kg)   SpO2 99%   BMI 26.69 kg/m      01/18/2023    2:25 PM 11/07/2022   11:03 AM 10/18/2022    3:10 PM  BP/Weight  Systolic BP 924 268 341  Diastolic BP 75 80 80  Wt. (Lbs) 160.4 161.4 159  BMI 26.69 kg/m2 26.86 kg/m2 26.46 kg/m2    Physical Exam Vitals and nursing note  reviewed.  Constitutional:      General: She is not in acute distress.    Appearance: Normal appearance.  HENT:     Head: Normocephalic and atraumatic.  Eyes:     General:        Right eye: No discharge.        Left eye: No discharge.     Conjunctiva/sclera: Conjunctivae normal.  Cardiovascular:     Rate and Rhythm: Normal rate and regular rhythm.  Pulmonary:     Effort: Pulmonary effort is normal.     Breath sounds: Normal breath sounds. No wheezing, rhonchi or rales.  Neurological:     Mental Status: She is alert.  Psychiatric:        Mood and Affect: Mood normal.        Behavior: Behavior normal.    Lab Results  Component Value Date   WBC 6.6 02/15/2022   HGB 12.5 02/15/2022   HCT 36.3 02/15/2022   PLT 242 02/15/2022   GLUCOSE 98 02/15/2022   CHOL 139 03/23/2021   TRIG 60 03/23/2021   HDL 37 (L) 03/23/2021   LDLCALC 89 03/23/2021   ALT 17 02/15/2022   AST 20 02/15/2022   NA 137 02/15/2022   K 3.6 02/15/2022   CL 107 02/15/2022   CREATININE 0.68 02/15/2022   BUN 14 02/15/2022   CO2 19 (L) 02/15/2022   HGBA1C 5.8 (H) 01/07/2013     Assessment & Plan:   Problem List Items Addressed This Visit       Digestive   Irritable bowel syndrome with diarrhea    Improved but still having intermittent vomiting.  I believe that her anxiety is playing a role as well.  Zofran as needed.      Relevant Medications   ondansetron (ZOFRAN-ODT) 4 MG disintegrating tablet     Other   Allergy to cats    Xyzal as directed.      Anxiety and depression    Uncontrolled.  Starting Cymbalta.      Relevant Medications   DULoxetine (CYMBALTA) 60 MG capsule    Meds ordered this encounter  Medications   ondansetron (ZOFRAN-ODT) 4 MG disintegrating tablet    Sig: Take 1 tablet (4 mg total) by mouth every 8 (eight) hours as needed for nausea or vomiting.    Dispense:  20 tablet    Refill:  0   levocetirizine (XYZAL ALLERGY 24HR) 5 MG tablet    Sig: Take 1 tablet (5 mg  total) by mouth every evening.    Dispense:  90 tablet    Refill:  1   DULoxetine (CYMBALTA) 60 MG capsule  Sig: Take 1 capsule (60 mg total) by mouth daily.    Dispense:  90 capsule    Refill:  3    Follow-up:  Return in about 6 weeks (around 03/01/2023) for Depression/Anxiety.  Everlene Other DO [facility]

## 2023-01-20 NOTE — Assessment & Plan Note (Signed)
Xyzal as directed.

## 2023-02-20 ENCOUNTER — Encounter (HOSPITAL_COMMUNITY): Payer: Self-pay | Admitting: Emergency Medicine

## 2023-02-20 ENCOUNTER — Emergency Department (HOSPITAL_COMMUNITY)
Admission: EM | Admit: 2023-02-20 | Discharge: 2023-02-20 | Disposition: A | Discharge status: Home / Self Care | Payer: 59 | Attending: Emergency Medicine | Admitting: Emergency Medicine

## 2023-02-20 ENCOUNTER — Other Ambulatory Visit: Payer: Self-pay

## 2023-02-20 DIAGNOSIS — S61211A Laceration without foreign body of left index finger without damage to nail, initial encounter: Secondary | ICD-10-CM | POA: Diagnosis not present

## 2023-02-20 DIAGNOSIS — W01110A Fall on same level from slipping, tripping and stumbling with subsequent striking against sharp glass, initial encounter: Secondary | ICD-10-CM | POA: Insufficient documentation

## 2023-02-20 DIAGNOSIS — Y990 Civilian activity done for income or pay: Secondary | ICD-10-CM | POA: Diagnosis not present

## 2023-02-20 MED ORDER — CEPHALEXIN 500 MG PO CAPS: 500.0000 mg | ORAL_CAPSULE | Freq: Two times a day (BID) | ORAL | 0 refills | Status: AC

## 2023-02-20 MED ORDER — LIDOCAINE HCL (PF) 2 % IJ SOLN
5.0000 mL | Freq: Once | INTRAMUSCULAR | Status: DC
Start: 2023-02-20 — End: 2023-02-20

## 2023-02-20 MED ORDER — LIDOCAINE HCL (PF) 2 % IJ SOLN
INTRAMUSCULAR | Status: AC
  Administered 2023-02-20: 5 mL via INTRADERMAL
  Filled 2023-02-20: qty 5

## 2023-02-20 NOTE — ED Notes (Addendum)
Pt reports that L Index finger is numb.

## 2023-02-20 NOTE — ED Provider Notes (Signed)
[facility] Provider Note   CSN: [id number] Arrival date & time: 02/20/23  [id number]     History  Chief Complaint  Patient presents with   Laceration    Alison Mcmillan is a 27 y.o. female.  Patient fell and cut the end of her left index finger.  Patient has no other medical problems  The history is provided by the patient and medical records. No language interpreter was used.  Laceration Location: Left index finger. Depth:  Cutaneous Quality: straight   Bleeding: controlled   Laceration mechanism:  Metal edge Pain details:    Quality:  Aching   Severity:  Mild Associated symptoms: no rash        Home Medications Prior to Admission medications   Medication Sig Start Date End Date Taking? Authorizing Provider  cephALEXin (KEFLEX) 500 MG capsule Take 1 capsule (500 mg total) by mouth 2 (two) times daily. 02/20/23  Yes Milton Ferguson, MD  DULoxetine (CYMBALTA) 60 MG capsule Take 1 capsule (60 mg total) by mouth daily. 01/18/23   Coral Spikes, DO  levocetirizine (XYZAL ALLERGY 24HR) 5 MG tablet Take 1 tablet (5 mg total) by mouth every evening. 01/18/23   Coral Spikes, DO  ondansetron (ZOFRAN-ODT) 4 MG disintegrating tablet Take 1 tablet (4 mg total) by mouth every 8 (eight) hours as needed for nausea or vomiting. 01/18/23   Coral Spikes, DO  pantoprazole (PROTONIX) 40 MG tablet Take 1 tablet (40 mg total) by mouth 2 (two) times daily before a meal. 10/18/22   Coral Spikes, DO      Allergies    Strattera [atomoxetine hcl]    Review of Systems   Review of Systems  Constitutional:  Negative for appetite change and fatigue.  HENT:  Negative for congestion, ear discharge and sinus pressure.   Eyes:  Negative for discharge.  Respiratory:  Negative for cough.   Cardiovascular:  Negative for chest pain.  Gastrointestinal:  Negative for abdominal pain and diarrhea.  Genitourinary:  Negative for frequency and hematuria.   Musculoskeletal:  Negative for back pain.       Left index finger laceration  Skin:  Negative for rash.  Neurological:  Negative for seizures and headaches.  Psychiatric/Behavioral:  Negative for hallucinations.     Physical Exam Updated Vital Signs BP 111/85   Pulse 76   Temp 97.7 F (36.5 C) (Oral)   Resp 18   Ht '5\' 5"'$  (1.651 m)   Wt 72.6 kg   LMP 02/12/2023 (Approximate)   SpO2 100%   BMI 26.63 kg/m  Physical Exam Vitals and nursing note reviewed.  Constitutional:      Appearance: She is well-developed.  HENT:     Head: Normocephalic.  Eyes:     Conjunctiva/sclera: Conjunctivae normal.  Neck:     Trachea: No tracheal deviation.  Cardiovascular:     Rate and Rhythm: Normal rate.     Heart sounds: No murmur heard. Pulmonary:     Effort: Pulmonary effort is normal.  Musculoskeletal:        General: Normal range of motion.     Comments: 1 cm superficial laceration to the distal left index finger  Skin:    General: Skin is warm.  Neurological:     Mental Status: She is alert and oriented to person, place, and time.     ED Results / Procedures / Treatments   Labs (all labs ordered are listed, but only  abnormal results are displayed) Labs Reviewed - No data to display  EKG None  Radiology No results found.  Procedures .Nerve Block  Date/Time: 02/22/2023 10:38 AM  Performed by: Milton Ferguson, MD Authorized by: Milton Ferguson, MD   Comments:     Patient had a digital block done to her left index finger.  Approximately 1 cc of 1% lidocaine without epi was injected to the base of either side of her left index finger.  Patient tolerated the procedure well. Marland Kitchen.Laceration Repair  Date/Time: 02/22/2023 10:39 AM  Performed by: Milton Ferguson, MD Authorized by: Milton Ferguson, MD   Laceration details:    Length (cm):  1 (1) Comments:     Patient had a 1 cm laceration to the distal left index finger.  Area was cleaned thoroughly with Betadine.  She was  anesthetized by digital block. One 5-0 nylon suture was used to close the laceration.  Also patient had some Dermabond used.  Patient tolerated procedure well     Medications Ordered in ED Medications - No data to display  ED Course/ Medical Decision Making/ A&P  Patient with 1 cm laceration to distal left index finger.  Digital block was done with 1% lidocaine and no epi.  11, 5-0 suture was used to close the laceration with Dermabond                           Medical Decision Making Risk Prescription drug management.     This patient presents to the ED for concern of laceration to left index finger, this involves an extensive number of treatment options, and is a complaint that carries with it a high risk of complications and morbidity.  The differential diagnosis includes ration to left index finger   Co morbidities that complicate the patient evaluation  None   Additional history obtained:  Additional history obtained from friend External records from outside source obtained and reviewed including hospital record   Lab Tests:  No labs Imaging Studies ordered: No imaging  Cardiac Monitoring: / EKG:  The patient was maintained on a cardiac monitor.  I personally viewed and interpreted the cardiac monitored which showed an underlying rhythm of: Normal sinus rhythm   Consultations Obtained:  No consultant Problem List / ED Course / Critical interventions / Medication management  Laceration I ordered medication including Keflex to prevent infection Reevaluation of the patient after these medicines showed that the patient improved I have reviewed the patients home medicines and have made adjustments as needed   Social Determinants of Health:      Test / Admission - Considered:  None Patient with 1 cm laceration to left distal index finger.  She will have her sutures removed in about 10 days        Final Clinical Impression(s) / ED Diagnoses Final  diagnoses:  None    Rx / DC Orders ED Discharge Orders          Ordered    cephALEXin (KEFLEX) 500 MG capsule  2 times daily        02/20/23 0810              Milton Ferguson, MD 02/22/23 1041

## 2023-02-20 NOTE — Discharge Instructions (Signed)
Clean laceration twice a day with soap and water.  Follow-up to have sutures out in 10 days

## 2023-02-20 NOTE — ED Triage Notes (Signed)
Pt POV with laceration to left index finger, cut on glass at work, bleeding controlled

## 2023-03-01 ENCOUNTER — Ambulatory Visit (INDEPENDENT_AMBULATORY_CARE_PROVIDER_SITE_OTHER): Payer: 59 | Admitting: Family Medicine

## 2023-03-01 VITALS — BP 118/85 | HR 74 | Temp 98.8°F | Ht 65.0 in | Wt 161.0 lb

## 2023-03-01 DIAGNOSIS — Z4802 Encounter for removal of sutures: Secondary | ICD-10-CM | POA: Diagnosis not present

## 2023-03-01 DIAGNOSIS — S61201D Unspecified open wound of left index finger without damage to nail, subsequent encounter: Secondary | ICD-10-CM

## 2023-03-01 DIAGNOSIS — F419 Anxiety disorder, unspecified: Secondary | ICD-10-CM

## 2023-03-01 DIAGNOSIS — K219 Gastro-esophageal reflux disease without esophagitis: Secondary | ICD-10-CM | POA: Diagnosis not present

## 2023-03-01 DIAGNOSIS — K580 Irritable bowel syndrome with diarrhea: Secondary | ICD-10-CM | POA: Diagnosis not present

## 2023-03-01 DIAGNOSIS — F32A Depression, unspecified: Secondary | ICD-10-CM

## 2023-03-01 MED ORDER — PANTOPRAZOLE SODIUM 40 MG PO TBEC: 40.0000 mg | DELAYED_RELEASE_TABLET | Freq: Two times a day (BID) | ORAL | 1 refills | Status: AC

## 2023-03-01 MED ORDER — ONDANSETRON 4 MG PO TBDP: 4.0000 mg | ORAL_TABLET | Freq: Three times a day (TID) | ORAL | 6 refills | Status: AC | PRN

## 2023-03-01 NOTE — Progress Notes (Signed)
Subjective:  Patient ID: Alison Mcmillan, female    DOB: 1996/10/24  Age: 27 y.o. MRN: [id number]  CC: Chief Complaint  Patient presents with   anxiety and depession     Follow up , has trouble remembering to take it daily   Suture / Staple Removal    Left index finger injury from 02/20/23    HPI:  27 year old female presents for follow-up.  Patient states that anxiety and depression has improved on Cymbalta.  She does have some difficulty remembering to take her medication.   Patient's IBS is stable on Protonix and Zofran.  Needs refills.  Additionally, patient recently suffered a laceration to the left index finger.  She was seen in the ER and 1 suture was placed in addition to Dermabond.  Patient needs suture removal.  Patient Active Problem List   Diagnosis Date Noted   Encounter for removal of sutures 03/01/2023   Allergy to cats 01/20/2023   Migraine with aura and without status migrainosus, not intractable 02/24/2022   Anxiety and depression 06/02/2021   Irritable bowel syndrome with diarrhea 05/21/2016   Asthma 11/04/2013   Gastroesophageal reflux     Social Hx   Social History   Socioeconomic History   Marital status: Single    Spouse name: Not on file   Number of children: Not on file   Years of education: Not on file   Highest education level: Not on file  Occupational History   Occupation: [employer]    Comment: Engineer, maintenance (IT)  Tobacco Use   Smoking status: Some Days    Packs/day: 0.25    Years: 2.00    Total pack years: 0.50    Types: Cigarettes   Smokeless tobacco: Never  Vaping Use   Vaping Use: Some days   Substances: Nicotine, Flavoring  Substance and Sexual Activity   Alcohol use: Not Currently    Comment: monthly   Drug use: No   Sexual activity: Not Currently    Birth control/protection: Condom, Injection  Other Topics Concern   Not on file  Social History Narrative   9th grade-homeschooled   Social Determinants of Health   Financial  Resource Strain: Low Risk  (06/02/2021)   Overall Financial Resource Strain (CARDIA)    Difficulty of Paying Living Expenses: Not very hard  Food Insecurity: No Food Insecurity (06/02/2021)   Hunger Vital Sign    Worried About Running Out of Food in the Last Year: Never true    Ran Out of Food in the Last Year: Never true  Transportation Needs: No Transportation Needs (06/02/2021)   PRAPARE - Hydrologist (Medical): No    Lack of Transportation (Non-Medical): No  Physical Activity: Inactive (06/02/2021)   Exercise Vital Sign    Days of Exercise per Week: 0 days    Minutes of Exercise per Session: 0 min  Stress: Stress Concern Present (06/02/2021)   [place]    Feeling of Stress : Very much  Social Connections: Socially Isolated (06/02/2021)   Social Connection and Isolation Panel [NHANES]    Frequency of Communication with Friends and Family: Twice a week    Frequency of Social Gatherings with Friends and Family: Once a week    Attends Religious Services: Never    Marine scientist or Organizations: No    Attends Archivist Meetings: Never    Marital Status: Never married    Review of  Systems Per HPI  Objective:  BP 118/85   Pulse 74   Temp 98.8 F (37.1 C)   Ht '5\' 5"'$  (1.651 m)   Wt 161 lb (73 kg)   LMP 02/12/2023 (Approximate)   SpO2 100%   BMI 26.79 kg/m      03/01/2023    2:15 PM 02/20/2023    8:00 AM 02/20/2023    7:14 AM  BP/Weight  Systolic BP 123456 [id number]  Diastolic BP 85 85 75  Wt. (Lbs) 161  160  BMI 26.79 kg/m2  26.63 kg/m2    Physical Exam Vitals and nursing note reviewed.  Constitutional:      General: She is not in acute distress.    Appearance: Normal appearance.  HENT:     Head: Normocephalic and atraumatic.  Cardiovascular:     Rate and Rhythm: Normal rate and regular rhythm.  Pulmonary:     Effort: Pulmonary effort is normal.     Breath  sounds: Normal breath sounds.  Neurological:     Mental Status: She is alert.  Psychiatric:        Mood and Affect: Mood normal.        Behavior: Behavior normal.     Lab Results  Component Value Date   WBC 6.6 02/15/2022   HGB 12.5 02/15/2022   HCT 36.3 02/15/2022   PLT 242 02/15/2022   GLUCOSE 98 02/15/2022   CHOL 139 03/23/2021   TRIG 60 03/23/2021   HDL 37 (L) 03/23/2021   LDLCALC 89 03/23/2021   ALT 17 02/15/2022   AST 20 02/15/2022   NA 137 02/15/2022   K 3.6 02/15/2022   CL 107 02/15/2022   CREATININE 0.68 02/15/2022   BUN 14 02/15/2022   CO2 19 (L) 02/15/2022   HGBA1C 5.8 (H) 01/07/2013     Assessment & Plan:   Problem List Items Addressed This Visit       Digestive   Irritable bowel syndrome with diarrhea    Continue Zofran as needed.  Medication refilled.      Relevant Medications   pantoprazole (PROTONIX) 40 MG tablet   ondansetron (ZOFRAN-ODT) 4 MG disintegrating tablet   Gastroesophageal reflux    Able.  Continue Protonix.  Refilled.      Relevant Medications   pantoprazole (PROTONIX) 40 MG tablet   ondansetron (ZOFRAN-ODT) 4 MG disintegrating tablet     Other   Encounter for removal of sutures    Single suture removed in standard fashion today without difficulty.      Anxiety and depression - Primary    Improved.  Continue Cymbalta.  Advised to set an alarm on her phone to make sure she takes her medication regularly.       Meds ordered this encounter  Medications   pantoprazole (PROTONIX) 40 MG tablet    Sig: Take 1 tablet (40 mg total) by mouth 2 (two) times daily before a meal.    Dispense:  180 tablet    Refill:  1   ondansetron (ZOFRAN-ODT) 4 MG disintegrating tablet    Sig: Take 1 tablet (4 mg total) by mouth every 8 (eight) hours as needed for nausea or vomiting.    Dispense:  20 tablet    Refill:  6    Follow-up:  Return in about 6 months (around 09/01/2023).  [place]

## 2023-03-01 NOTE — Assessment & Plan Note (Signed)
Single suture removed in standard fashion today without difficulty.

## 2023-03-01 NOTE — Assessment & Plan Note (Signed)
Improved.  Continue Cymbalta.  Advised to set an alarm on her phone to make sure she takes her medication regularly.

## 2023-03-01 NOTE — Assessment & Plan Note (Signed)
Able.  Continue Protonix.  Refilled.

## 2023-03-01 NOTE — Patient Instructions (Signed)
Set an alarm for your meds.  Follow up in 6 months.

## 2023-03-01 NOTE — Assessment & Plan Note (Signed)
Continue Zofran as needed.  Medication refilled.

## 2023-09-03 ENCOUNTER — Ambulatory Visit: Payer: 59 | Admitting: Family Medicine

## 2023-10-21 ENCOUNTER — Other Ambulatory Visit: Payer: Self-pay

## 2023-10-21 ENCOUNTER — Encounter (HOSPITAL_COMMUNITY): Payer: Self-pay

## 2023-10-21 ENCOUNTER — Emergency Department (HOSPITAL_COMMUNITY)
Admission: EM | Admit: 2023-10-21 | Discharge: 2023-10-21 | Discharge status: Against Medical Advice | Payer: Self-pay | Attending: Emergency Medicine | Admitting: Emergency Medicine

## 2023-10-21 DIAGNOSIS — M7918 Myalgia, other site: Secondary | ICD-10-CM | POA: Insufficient documentation

## 2023-10-21 DIAGNOSIS — Z20822 Contact with and (suspected) exposure to covid-19: Secondary | ICD-10-CM | POA: Insufficient documentation

## 2023-10-21 DIAGNOSIS — R519 Headache, unspecified: Secondary | ICD-10-CM | POA: Insufficient documentation

## 2023-10-21 DIAGNOSIS — R059 Cough, unspecified: Secondary | ICD-10-CM | POA: Insufficient documentation

## 2023-10-21 DIAGNOSIS — R0989 Other specified symptoms and signs involving the circulatory and respiratory systems: Secondary | ICD-10-CM | POA: Insufficient documentation

## 2023-10-21 DIAGNOSIS — Z5321 Procedure and treatment not carried out due to patient leaving prior to being seen by health care provider: Secondary | ICD-10-CM | POA: Insufficient documentation

## 2023-10-21 LAB — SARS CORONAVIRUS 2 BY RT PCR: SARS Coronavirus 2 by RT PCR: NEGATIVE

## 2023-10-21 NOTE — ED Triage Notes (Signed)
Pt presents to ED with complaints of generalized body aches, headache, cough, runny nose x 2 days.

## 2023-10-21 NOTE — ED Notes (Signed)
Pt reports to security that she is leaving

## 2023-11-20 ENCOUNTER — Other Ambulatory Visit: Payer: Self-pay

## 2023-11-20 ENCOUNTER — Emergency Department (HOSPITAL_COMMUNITY): Payer: Self-pay

## 2023-11-20 ENCOUNTER — Emergency Department (HOSPITAL_COMMUNITY)
Admission: EM | Admit: 2023-11-20 | Discharge: 2023-11-20 | Disposition: A | Discharge status: Home / Self Care | Payer: Self-pay | Attending: Emergency Medicine | Admitting: Emergency Medicine

## 2023-11-20 DIAGNOSIS — J0100 Acute maxillary sinusitis, unspecified: Secondary | ICD-10-CM | POA: Insufficient documentation

## 2023-11-20 MED ORDER — OXYMETAZOLINE HCL 0.05 % NA SOLN
1.0000 | Freq: Once | NASAL | Status: AC
  Administered 2023-11-20: 1 via NASAL
  Filled 2023-11-20: qty 30

## 2023-11-20 MED ORDER — DOXYCYCLINE HYCLATE 100 MG PO CAPS: 100.0000 mg | ORAL_CAPSULE | Freq: Two times a day (BID) | ORAL | 0 refills | Status: AC

## 2023-11-20 NOTE — ED Provider Notes (Signed)
[place] EMERGENCY DEPARTMENT AT [facility] Provider Note   CSN: [id number] Arrival date & time: 11/20/23  2585     History  Chief Complaint  Patient presents with   Cough    Alison Mcmillan is a 27 y.o. female.  27 year old female with a history of rhinitis and bronchitis who presents to the emergency department with congestion, cough, and mild shortness of breath.  For the past month has been having a cough.  Says that she typically does not produce any sputum.  At night now she feels like she is drowning despite taking Mucinex DM.  Has not tried any other OTC medications.  No fevers or sick contacts.  No leg swelling.  No chest pain aside from when she coughs.  Does not smoke cigarettes regularly.  Did have this happen last year and was treated with doxycycline and prednisone.       Home Medications Prior to Admission medications   Medication Sig Start Date End Date Taking? Authorizing Provider  doxycycline (VIBRAMYCIN) 100 MG capsule Take 1 capsule (100 mg total) by mouth 2 (two) times daily. 11/20/23  Yes Rondel Baton, MD  cephALEXin (KEFLEX) 500 MG capsule Take 1 capsule (500 mg total) by mouth 2 (two) times daily. 02/20/23   Bethann Berkshire, MD  DULoxetine (CYMBALTA) 60 MG capsule Take 1 capsule (60 mg total) by mouth daily. 01/18/23   Tommie Sams, DO  levocetirizine (XYZAL ALLERGY 24HR) 5 MG tablet Take 1 tablet (5 mg total) by mouth every evening. 01/18/23   Tommie Sams, DO  ondansetron (ZOFRAN-ODT) 4 MG disintegrating tablet Take 1 tablet (4 mg total) by mouth every 8 (eight) hours as needed for nausea or vomiting. 03/01/23   Tommie Sams, DO  pantoprazole (PROTONIX) 40 MG tablet Take 1 tablet (40 mg total) by mouth 2 (two) times daily before a meal. 03/01/23   Tommie Sams, DO      Allergies    Strattera [atomoxetine hcl]    Review of Systems   Review of Systems  Physical Exam Updated Vital Signs BP 121/85 (BP Location: Right Arm)   Pulse 68    Temp 98.3 F (36.8 C) (Oral)   Resp 17   LMP 10/07/2023   SpO2 98%  Physical Exam Vitals and nursing note reviewed.  Constitutional:      General: She is not in acute distress.    Appearance: She is well-developed.     Comments: Coughing frequently during exam  HENT:     Head: Normocephalic and atraumatic.     Right Ear: Tympanic membrane, ear canal and external ear normal.     Left Ear: Tympanic membrane, ear canal and external ear normal.     Nose: Nose normal.     Mouth/Throat:     Mouth: Mucous membranes are moist.     Pharynx: Posterior oropharyngeal erythema present. No oropharyngeal exudate.  Eyes:     Extraocular Movements: Extraocular movements intact.     Conjunctiva/sclera: Conjunctivae normal.     Pupils: Pupils are equal, round, and reactive to light.  Cardiovascular:     Rate and Rhythm: Normal rate and regular rhythm.     Heart sounds: No murmur heard. Pulmonary:     Effort: Pulmonary effort is normal. No respiratory distress.     Breath sounds: Normal breath sounds.  Musculoskeletal:     Cervical back: Normal range of motion and neck supple.     Right lower leg: No edema.  Left lower leg: No edema.  Skin:    General: Skin is warm and dry.  Neurological:     Mental Status: She is alert and oriented to person, place, and time. Mental status is at baseline.  Psychiatric:        Mood and Affect: Mood normal.     ED Results / Procedures / Treatments   Labs (all labs ordered are listed, but only abnormal results are displayed) Labs Reviewed - No data to display  EKG None  Radiology DG Chest 2 View  Result Date: 11/20/2023 CLINICAL DATA:  27 year old female with cough and shortness of breath. EXAM: CHEST - 2 VIEW COMPARISON:  Chest radiographs 10/18/2022 and earlier. FINDINGS: PA and lateral views are 0654 hours. Lung volumes and mediastinal contours remain normal. Visualized tracheal air column is within normal limits. Lungs appear stable and  clear. No pneumothorax or pleural effusion. Negative visible bowel gas and osseous structures. IMPRESSION: Negative.  No cardiopulmonary abnormality. Electronically Signed   By: Odessa Fleming M.D.   On: 11/20/2023 08:28    Procedures Procedures    Medications Ordered in ED Medications  oxymetazoline (AFRIN) 0.05 % nasal spray 1 spray (1 spray Each Nare Given 11/20/23 0910)    ED Course/ Medical Decision Making/ A&P                                 Medical Decision Making Amount and/or Complexity of Data Reviewed Radiology: ordered.  Risk OTC drugs. Prescription drug management.   Alison Mcmillan is a 28 y.o. female with comorbidities that complicate the patient evaluation including rhinitis and bronchitis who presents to the emergency department with congestion, cough, and mild shortness of breath.    Initial Ddx:  Allergic rhinitis, sinusitis, bronchitis, pneumonia, URI, GERD  MDM/Course:  Patient presents to the emergency department with a month of a cough.  Does have some nocturnal symptoms as well as congestion.  On exam has bilateral maxillary sinus tenderness to palpation.  I suspect that she probably has allergic rhinitis causing her symptoms.  Will go ahead and give her some Afrin to take home.  With her sinus tenderness palpation given initially of sinusitis we will give her some doxycycline as well.  Did have a chest x-ray which did not show evidence of pneumonia.  Will have her follow-up with her primary doctor in several days.  This patient presents to the ED for concern of complaints listed in HPI, this involves an extensive number of treatment options, and is a complaint that carries with it a high risk of complications and morbidity. Disposition including potential need for admission considered.   Dispo: DC Home. Return precautions discussed including, but not limited to, those listed in the AVS. Allowed pt time to ask questions which were answered fully prior to  dc.  Records reviewed Outpatient Clinic Notes I independently reviewed the following imaging with scope of interpretation limited to determining acute life threatening conditions related to emergency care: Chest x-ray and agree with the radiologist interpretation with the following exceptions: none I personally reviewed and interpreted cardiac monitoring: normal sinus rhythm  I personally reviewed and interpreted the pt's EKG: see above for interpretation  I have reviewed the patients home medications and made adjustments as needed  Portions of this note were generated with Dragon dictation software. Dictation errors may occur despite best attempts at proofreading.  Final Clinical Impression(s) / ED Diagnoses Final diagnoses:  Subacute maxillary sinusitis    Rx / DC Orders ED Discharge Orders          Ordered    doxycycline (VIBRAMYCIN) 100 MG capsule  2 times daily        11/20/23 0811              Rondel Baton, MD 11/20/23 1958

## 2023-11-20 NOTE — ED Notes (Signed)
Pt sitting up in bed, pt states that she is ready to go home, pt verbalized understanding d/c and follow up, advised to return for any concerns or worsening symptoms. Pt ambulatory from department, resps even and unlabored

## 2023-11-20 NOTE — ED Triage Notes (Addendum)
Pt states she has had a cough for 4 weeks, reports cough has gotten worse over the last few days and is now having SOB.

## 2023-11-20 NOTE — ED Notes (Signed)
Pt sitting up in bed, pt c/o cough, pt has a dry unproductive cough, resps even and unlabored, pt talking in full sentences.  Pt awaits x ray result.

## 2023-11-20 NOTE — ED Notes (Signed)
Patient transported to X-ray 

## 2023-11-20 NOTE — Discharge Instructions (Addendum)
You were seen for your cough in the emergency department.  It is likely due to a sinus infection.  At home, please use the Afrin (decongestant) that we gave you along with the Mucinex you have been taking.  Take the antibiotics in case there is a bacterial infection.  Use over-the-counter medication and tea with honey for your cough.  Check your MyChart online for the results of any tests that had not resulted by the time you left the emergency department.   Follow-up with your primary doctor in 2-3 days regarding your visit.    Return immediately to the emergency department if you experience any of the following: Difficulty breathing, or any other concerning symptoms.    Thank you for visiting our Emergency Department. It was a pleasure taking care of you today.
